# Patient Record
Sex: Male | Born: 1945
Health system: Southern US, Community
[De-identification: ages and names within clinical notes are randomized; demographics above are authoritative.]

## PROBLEM LIST (undated history)

## (undated) DIAGNOSIS — R7303 Prediabetes: Secondary | ICD-10-CM

## (undated) DIAGNOSIS — I10 Essential (primary) hypertension: Secondary | ICD-10-CM

## (undated) HISTORY — PX: HERNIA REPAIR: SHX51

---

## 2005-06-16 ENCOUNTER — Emergency Department (HOSPITAL_COMMUNITY): Admission: EM | Admit: 2005-06-16 | Discharge: 2005-06-16 | Payer: Self-pay | Admitting: Emergency Medicine

## 2005-06-29 ENCOUNTER — Inpatient Hospital Stay (HOSPITAL_COMMUNITY): Admission: RE | Admit: 2005-06-29 | Discharge: 2005-07-02 | Payer: Self-pay | Admitting: Orthopedic Surgery

## 2015-06-06 DIAGNOSIS — Z0000 Encounter for general adult medical examination without abnormal findings: Secondary | ICD-10-CM | POA: Diagnosis not present

## 2015-07-05 DIAGNOSIS — H25813 Combined forms of age-related cataract, bilateral: Secondary | ICD-10-CM | POA: Diagnosis not present

## 2015-09-09 DIAGNOSIS — H25811 Combined forms of age-related cataract, right eye: Secondary | ICD-10-CM | POA: Diagnosis not present

## 2015-09-13 DIAGNOSIS — I10 Essential (primary) hypertension: Secondary | ICD-10-CM | POA: Diagnosis not present

## 2015-09-27 DIAGNOSIS — H25811 Combined forms of age-related cataract, right eye: Secondary | ICD-10-CM | POA: Diagnosis not present

## 2015-09-28 DIAGNOSIS — H25811 Combined forms of age-related cataract, right eye: Secondary | ICD-10-CM | POA: Diagnosis not present

## 2015-10-25 DIAGNOSIS — I10 Essential (primary) hypertension: Secondary | ICD-10-CM | POA: Diagnosis not present

## 2015-10-25 DIAGNOSIS — Z79899 Other long term (current) drug therapy: Secondary | ICD-10-CM | POA: Diagnosis not present

## 2015-10-25 DIAGNOSIS — H25812 Combined forms of age-related cataract, left eye: Secondary | ICD-10-CM | POA: Diagnosis not present

## 2015-10-25 DIAGNOSIS — H259 Unspecified age-related cataract: Secondary | ICD-10-CM | POA: Diagnosis not present

## 2015-10-25 DIAGNOSIS — Z87891 Personal history of nicotine dependence: Secondary | ICD-10-CM | POA: Diagnosis not present

## 2015-10-25 DIAGNOSIS — M1990 Unspecified osteoarthritis, unspecified site: Secondary | ICD-10-CM | POA: Diagnosis not present

## 2015-10-26 DIAGNOSIS — H25812 Combined forms of age-related cataract, left eye: Secondary | ICD-10-CM | POA: Diagnosis not present

## 2015-11-08 DIAGNOSIS — I10 Essential (primary) hypertension: Secondary | ICD-10-CM | POA: Diagnosis not present

## 2015-11-08 DIAGNOSIS — R739 Hyperglycemia, unspecified: Secondary | ICD-10-CM | POA: Diagnosis not present

## 2015-11-22 DIAGNOSIS — Z961 Presence of intraocular lens: Secondary | ICD-10-CM | POA: Diagnosis not present

## 2015-12-20 DIAGNOSIS — R8290 Unspecified abnormal findings in urine: Secondary | ICD-10-CM | POA: Diagnosis not present

## 2015-12-20 DIAGNOSIS — I10 Essential (primary) hypertension: Secondary | ICD-10-CM | POA: Diagnosis not present

## 2015-12-20 DIAGNOSIS — Z125 Encounter for screening for malignant neoplasm of prostate: Secondary | ICD-10-CM | POA: Diagnosis not present

## 2015-12-20 DIAGNOSIS — Z0000 Encounter for general adult medical examination without abnormal findings: Secondary | ICD-10-CM | POA: Diagnosis not present

## 2016-02-15 DIAGNOSIS — J069 Acute upper respiratory infection, unspecified: Secondary | ICD-10-CM | POA: Diagnosis not present

## 2016-02-15 DIAGNOSIS — N289 Disorder of kidney and ureter, unspecified: Secondary | ICD-10-CM | POA: Diagnosis not present

## 2016-02-15 DIAGNOSIS — R05 Cough: Secondary | ICD-10-CM | POA: Diagnosis not present

## 2016-02-15 DIAGNOSIS — J029 Acute pharyngitis, unspecified: Secondary | ICD-10-CM | POA: Diagnosis not present

## 2016-02-15 DIAGNOSIS — R739 Hyperglycemia, unspecified: Secondary | ICD-10-CM | POA: Diagnosis not present

## 2016-02-15 DIAGNOSIS — E782 Mixed hyperlipidemia: Secondary | ICD-10-CM | POA: Diagnosis not present

## 2016-02-15 DIAGNOSIS — I10 Essential (primary) hypertension: Secondary | ICD-10-CM | POA: Diagnosis not present

## 2016-05-22 DIAGNOSIS — H35371 Puckering of macula, right eye: Secondary | ICD-10-CM | POA: Diagnosis not present

## 2016-05-22 DIAGNOSIS — Z961 Presence of intraocular lens: Secondary | ICD-10-CM | POA: Diagnosis not present

## 2016-06-11 DIAGNOSIS — R739 Hyperglycemia, unspecified: Secondary | ICD-10-CM | POA: Diagnosis not present

## 2016-06-11 DIAGNOSIS — E782 Mixed hyperlipidemia: Secondary | ICD-10-CM | POA: Diagnosis not present

## 2016-06-11 DIAGNOSIS — I10 Essential (primary) hypertension: Secondary | ICD-10-CM | POA: Diagnosis not present

## 2016-09-11 DIAGNOSIS — Z79899 Other long term (current) drug therapy: Secondary | ICD-10-CM | POA: Diagnosis not present

## 2016-09-11 DIAGNOSIS — I10 Essential (primary) hypertension: Secondary | ICD-10-CM | POA: Diagnosis not present

## 2016-09-11 DIAGNOSIS — R739 Hyperglycemia, unspecified: Secondary | ICD-10-CM | POA: Diagnosis not present

## 2016-09-11 DIAGNOSIS — E782 Mixed hyperlipidemia: Secondary | ICD-10-CM | POA: Diagnosis not present

## 2017-01-02 DIAGNOSIS — Z0000 Encounter for general adult medical examination without abnormal findings: Secondary | ICD-10-CM | POA: Diagnosis not present

## 2017-01-02 DIAGNOSIS — E785 Hyperlipidemia, unspecified: Secondary | ICD-10-CM | POA: Diagnosis not present

## 2017-05-27 DIAGNOSIS — Z961 Presence of intraocular lens: Secondary | ICD-10-CM | POA: Diagnosis not present

## 2017-05-27 DIAGNOSIS — H43812 Vitreous degeneration, left eye: Secondary | ICD-10-CM | POA: Diagnosis not present

## 2017-07-01 DIAGNOSIS — H43812 Vitreous degeneration, left eye: Secondary | ICD-10-CM | POA: Diagnosis not present

## 2017-08-28 DIAGNOSIS — Z79899 Other long term (current) drug therapy: Secondary | ICD-10-CM | POA: Diagnosis not present

## 2017-08-28 DIAGNOSIS — Z136 Encounter for screening for cardiovascular disorders: Secondary | ICD-10-CM | POA: Diagnosis not present

## 2017-08-28 DIAGNOSIS — I10 Essential (primary) hypertension: Secondary | ICD-10-CM | POA: Diagnosis not present

## 2017-08-28 DIAGNOSIS — Z125 Encounter for screening for malignant neoplasm of prostate: Secondary | ICD-10-CM | POA: Diagnosis not present

## 2018-03-03 DIAGNOSIS — J209 Acute bronchitis, unspecified: Secondary | ICD-10-CM | POA: Diagnosis not present

## 2018-03-03 DIAGNOSIS — I10 Essential (primary) hypertension: Secondary | ICD-10-CM | POA: Diagnosis not present

## 2018-03-03 DIAGNOSIS — Z9181 History of falling: Secondary | ICD-10-CM | POA: Diagnosis not present

## 2018-03-03 DIAGNOSIS — E785 Hyperlipidemia, unspecified: Secondary | ICD-10-CM | POA: Diagnosis not present

## 2018-03-03 DIAGNOSIS — Z1331 Encounter for screening for depression: Secondary | ICD-10-CM | POA: Diagnosis not present

## 2018-03-03 DIAGNOSIS — R972 Elevated prostate specific antigen [PSA]: Secondary | ICD-10-CM | POA: Diagnosis not present

## 2018-03-03 DIAGNOSIS — R739 Hyperglycemia, unspecified: Secondary | ICD-10-CM | POA: Diagnosis not present

## 2018-04-04 DIAGNOSIS — R001 Bradycardia, unspecified: Secondary | ICD-10-CM | POA: Diagnosis not present

## 2018-04-04 DIAGNOSIS — R079 Chest pain, unspecified: Secondary | ICD-10-CM | POA: Diagnosis not present

## 2018-04-04 DIAGNOSIS — R42 Dizziness and giddiness: Secondary | ICD-10-CM | POA: Diagnosis not present

## 2018-04-04 DIAGNOSIS — I951 Orthostatic hypotension: Secondary | ICD-10-CM | POA: Diagnosis not present

## 2018-04-04 DIAGNOSIS — I4510 Unspecified right bundle-branch block: Secondary | ICD-10-CM | POA: Diagnosis not present

## 2018-04-04 DIAGNOSIS — S2249XA Multiple fractures of ribs, unspecified side, initial encounter for closed fracture: Secondary | ICD-10-CM | POA: Diagnosis not present

## 2018-04-04 DIAGNOSIS — S2242XA Multiple fractures of ribs, left side, initial encounter for closed fracture: Secondary | ICD-10-CM | POA: Diagnosis not present

## 2018-04-04 DIAGNOSIS — K5730 Diverticulosis of large intestine without perforation or abscess without bleeding: Secondary | ICD-10-CM | POA: Diagnosis not present

## 2018-04-04 DIAGNOSIS — R109 Unspecified abdominal pain: Secondary | ICD-10-CM | POA: Diagnosis not present

## 2018-04-04 DIAGNOSIS — R0902 Hypoxemia: Secondary | ICD-10-CM | POA: Diagnosis not present

## 2018-04-04 DIAGNOSIS — R55 Syncope and collapse: Secondary | ICD-10-CM | POA: Diagnosis not present

## 2018-04-04 DIAGNOSIS — W19XXXA Unspecified fall, initial encounter: Secondary | ICD-10-CM | POA: Diagnosis not present

## 2018-04-04 DIAGNOSIS — R531 Weakness: Secondary | ICD-10-CM | POA: Diagnosis not present

## 2018-04-05 ENCOUNTER — Inpatient Hospital Stay (HOSPITAL_COMMUNITY)
Admission: AD | Admit: 2018-04-05 | Discharge: 2018-04-07 | DRG: 312 | Disposition: A | Discharge status: Home / Self Care | Payer: Medicare HMO | Source: Other Acute Inpatient Hospital | Attending: Cardiovascular Disease | Admitting: Cardiovascular Disease

## 2018-04-05 ENCOUNTER — Inpatient Hospital Stay (HOSPITAL_COMMUNITY): Payer: Medicare HMO

## 2018-04-05 ENCOUNTER — Encounter (HOSPITAL_COMMUNITY): Payer: Self-pay | Admitting: Cardiology

## 2018-04-05 DIAGNOSIS — Z8249 Family history of ischemic heart disease and other diseases of the circulatory system: Secondary | ICD-10-CM

## 2018-04-05 DIAGNOSIS — K0889 Other specified disorders of teeth and supporting structures: Secondary | ICD-10-CM | POA: Diagnosis present

## 2018-04-05 DIAGNOSIS — Y92002 Bathroom of unspecified non-institutional (private) residence single-family (private) house as the place of occurrence of the external cause: Secondary | ICD-10-CM

## 2018-04-05 DIAGNOSIS — Z882 Allergy status to sulfonamides status: Secondary | ICD-10-CM | POA: Diagnosis not present

## 2018-04-05 DIAGNOSIS — S2249XA Multiple fractures of ribs, unspecified side, initial encounter for closed fracture: Secondary | ICD-10-CM

## 2018-04-05 DIAGNOSIS — S2242XA Multiple fractures of ribs, left side, initial encounter for closed fracture: Secondary | ICD-10-CM | POA: Diagnosis present

## 2018-04-05 DIAGNOSIS — Z79899 Other long term (current) drug therapy: Secondary | ICD-10-CM

## 2018-04-05 DIAGNOSIS — W1812XA Fall from or off toilet with subsequent striking against object, initial encounter: Secondary | ICD-10-CM | POA: Diagnosis present

## 2018-04-05 DIAGNOSIS — R55 Syncope and collapse: Secondary | ICD-10-CM | POA: Diagnosis not present

## 2018-04-05 DIAGNOSIS — Z7982 Long term (current) use of aspirin: Secondary | ICD-10-CM | POA: Diagnosis not present

## 2018-04-05 DIAGNOSIS — R109 Unspecified abdominal pain: Secondary | ICD-10-CM | POA: Diagnosis not present

## 2018-04-05 DIAGNOSIS — I10 Essential (primary) hypertension: Secondary | ICD-10-CM | POA: Diagnosis not present

## 2018-04-05 DIAGNOSIS — R001 Bradycardia, unspecified: Secondary | ICD-10-CM | POA: Diagnosis present

## 2018-04-05 DIAGNOSIS — Z9181 History of falling: Secondary | ICD-10-CM

## 2018-04-05 DIAGNOSIS — I4510 Unspecified right bundle-branch block: Secondary | ICD-10-CM | POA: Diagnosis not present

## 2018-04-05 DIAGNOSIS — S2239XA Fracture of one rib, unspecified side, initial encounter for closed fracture: Secondary | ICD-10-CM

## 2018-04-05 DIAGNOSIS — R079 Chest pain, unspecified: Secondary | ICD-10-CM | POA: Diagnosis not present

## 2018-04-05 HISTORY — DX: Essential (primary) hypertension: I10

## 2018-04-05 HISTORY — DX: Prediabetes: R73.03

## 2018-04-05 LAB — CBC WITH DIFFERENTIAL/PLATELET
ABS IMMATURE GRANULOCYTES: 0.04 10*3/uL (ref 0.00–0.07)
Basophils Absolute: 0 10*3/uL (ref 0.0–0.1)
Basophils Relative: 0 %
Eosinophils Absolute: 0 10*3/uL (ref 0.0–0.5)
Eosinophils Relative: 0 %
HCT: 42.4 % (ref 39.0–52.0)
Hemoglobin: 14.1 g/dL (ref 13.0–17.0)
Immature Granulocytes: 0 %
LYMPHS ABS: 1.8 10*3/uL (ref 0.7–4.0)
Lymphocytes Relative: 16 %
MCH: 30.7 pg (ref 26.0–34.0)
MCHC: 33.3 g/dL (ref 30.0–36.0)
MCV: 92.4 fL (ref 80.0–100.0)
Monocytes Absolute: 1.2 10*3/uL — ABNORMAL HIGH (ref 0.1–1.0)
Monocytes Relative: 11 %
NRBC: 0 % (ref 0.0–0.2)
Neutro Abs: 8 10*3/uL — ABNORMAL HIGH (ref 1.7–7.7)
Neutrophils Relative %: 73 %
Platelets: 172 10*3/uL (ref 150–400)
RBC: 4.59 MIL/uL (ref 4.22–5.81)
RDW: 13.2 % (ref 11.5–15.5)
WBC: 11 10*3/uL — ABNORMAL HIGH (ref 4.0–10.5)

## 2018-04-05 LAB — PROTIME-INR
INR: 1 (ref 0.8–1.2)
Prothrombin Time: 12.8 seconds (ref 11.4–15.2)

## 2018-04-05 LAB — MRSA PCR SCREENING: MRSA by PCR: NEGATIVE

## 2018-04-05 LAB — URINALYSIS, ROUTINE W REFLEX MICROSCOPIC
Bilirubin Urine: NEGATIVE
Glucose, UA: 50 mg/dL — AB
Hgb urine dipstick: NEGATIVE
KETONES UR: 5 mg/dL — AB
Leukocytes,Ua: NEGATIVE
Nitrite: NEGATIVE
PROTEIN: NEGATIVE mg/dL
Specific Gravity, Urine: 1.042 — ABNORMAL HIGH (ref 1.005–1.030)
pH: 5 (ref 5.0–8.0)

## 2018-04-05 LAB — COMPREHENSIVE METABOLIC PANEL
ALT: 16 U/L (ref 0–44)
AST: 19 U/L (ref 15–41)
Albumin: 3.3 g/dL — ABNORMAL LOW (ref 3.5–5.0)
Alkaline Phosphatase: 51 U/L (ref 38–126)
Anion gap: 8 (ref 5–15)
BUN: 11 mg/dL (ref 8–23)
CO2: 17 mmol/L — ABNORMAL LOW (ref 22–32)
Calcium: 8.5 mg/dL — ABNORMAL LOW (ref 8.9–10.3)
Chloride: 108 mmol/L (ref 98–111)
Creatinine, Ser: 0.92 mg/dL (ref 0.61–1.24)
GFR calc Af Amer: >60 mL/min (ref >60)
GFR calc non Af Amer: >60 mL/min (ref >60)
Glucose, Bld: 108 mg/dL — ABNORMAL HIGH (ref 70–99)
POTASSIUM: 4.3 mmol/L (ref 3.5–5.1)
SODIUM: 133 mmol/L — AB (ref 135–145)
Total Bilirubin: 1.1 mg/dL (ref 0.3–1.2)
Total Protein: 6.3 g/dL — ABNORMAL LOW (ref 6.5–8.1)

## 2018-04-05 LAB — ECHOCARDIOGRAM COMPLETE
Height: 72 in
Weight: 2987.67 [oz_av]

## 2018-04-05 LAB — MAGNESIUM: Magnesium: 1.8 mg/dL (ref 1.7–2.4)

## 2018-04-05 MED ORDER — IPRATROPIUM-ALBUTEROL 0.5-2.5 (3) MG/3ML IN SOLN: 3.0000 mL | RESPIRATORY_TRACT | Status: DC | PRN

## 2018-04-05 MED ORDER — DOPAMINE-DEXTROSE 3.2-5 MG/ML-% IV SOLN: 5.0000 ug/kg/min | INTRAVENOUS | Status: DC

## 2018-04-05 MED ORDER — FOLIC ACID 1 MG PO TABS
1.0000 mg | ORAL_TABLET | Freq: Every day | ORAL | Status: DC
  Administered 2018-04-05 – 2018-04-07 (×3): 1 mg via ORAL
  Filled 2018-04-05 (×3): qty 1

## 2018-04-05 MED ORDER — TRAMADOL HCL 50 MG PO TABS
50.0000 mg | ORAL_TABLET | Freq: Four times a day (QID) | ORAL | Status: DC
  Administered 2018-04-05 – 2018-04-07 (×9): 50 mg via ORAL
  Filled 2018-04-05 (×9): qty 1

## 2018-04-05 MED ORDER — ATROPINE SULFATE 1 MG/10ML IJ SOSY
PREFILLED_SYRINGE | INTRAMUSCULAR | Status: AC
  Administered 2018-04-05: 0.5 mg via INTRAVENOUS
  Filled 2018-04-05: qty 10

## 2018-04-05 MED ORDER — THIAMINE HCL 100 MG/ML IJ SOLN
100.0000 mg | Freq: Every day | INTRAMUSCULAR | Status: DC
  Administered 2018-04-05: 100 mg via INTRAVENOUS
  Filled 2018-04-05: qty 2

## 2018-04-05 MED ORDER — ONDANSETRON HCL 4 MG/2ML IJ SOLN
4.0000 mg | Freq: Four times a day (QID) | INTRAMUSCULAR | Status: DC | PRN
  Administered 2018-04-06: 4 mg via INTRAVENOUS
  Filled 2018-04-05: qty 2

## 2018-04-05 MED ORDER — CALCIUM CARBONATE ANTACID 500 MG PO CHEW
1.0000 | CHEWABLE_TABLET | Freq: Three times a day (TID) | ORAL | Status: DC | PRN
  Administered 2018-04-05: 200 mg via ORAL
  Filled 2018-04-05: qty 1

## 2018-04-05 MED ORDER — ACETAMINOPHEN 325 MG PO TABS
650.0000 mg | ORAL_TABLET | Freq: Four times a day (QID) | ORAL | Status: DC | PRN
  Administered 2018-04-05: 650 mg via ORAL
  Filled 2018-04-05: qty 2

## 2018-04-05 MED ORDER — SODIUM CHLORIDE 0.9 % IV SOLN
INTRAVENOUS | Status: AC
Start: 2018-04-05 — End: 2018-04-05
  Administered 2018-04-05: 04:00:00 via INTRAVENOUS

## 2018-04-05 MED ORDER — ACETAMINOPHEN 325 MG PO TABS: 650.0000 mg | ORAL_TABLET | ORAL | Status: DC | PRN

## 2018-04-05 MED ORDER — HEPARIN SODIUM (PORCINE) 5000 UNIT/ML IJ SOLN
5000.0000 [IU] | Freq: Three times a day (TID) | INTRAMUSCULAR | Status: DC
  Administered 2018-04-05 – 2018-04-07 (×7): 5000 [IU] via SUBCUTANEOUS
  Filled 2018-04-05 (×6): qty 1

## 2018-04-05 MED ORDER — LIDOCAINE 5 % EX PTCH
1.0000 | MEDICATED_PATCH | Freq: Every day | CUTANEOUS | Status: DC
  Administered 2018-04-05 – 2018-04-07 (×3): 1 via TRANSDERMAL
  Filled 2018-04-05 (×4): qty 1

## 2018-04-05 MED ORDER — ATROPINE SULFATE 1 MG/10ML IJ SOSY
0.5000 mg | PREFILLED_SYRINGE | INTRAMUSCULAR | Status: DC | PRN
  Administered 2018-04-05: 0.5 mg via INTRAVENOUS

## 2018-04-05 NOTE — Consult Note (Signed)
Reason for Consult:L rib FXs Referring Physician: Jerilynn Mages Jerome Houston is an 73 y.o. male.  HPI: Jerome Houston has a history of hypertension and prediabetes.  He had been feeling well until yesterday when he had 2 episodes of lightheadedness and syncope.  The second time, he fell in the bathroom and struck his left side on the tub.  He was brought to the emergency department at [facility] for further evaluation.  He was found to have multiple left rib fractures reported to me by the PA at the [facility] ED as 7-11 with no PTX. While in the ED, he had episodes of bradycardia into the 40s with associated drop in systolic blood pressure to the 80s.  He was transferred and admitted to the cardiology service for further work-up of this bradycardia and possible pacemaker placement.  We are consulted to assist with the rib fractures.  He does complain of left-sided chest pain, exacerbated by deep inspiration.  He denies pain elsewhere.  Past medical history: Hypertension, prediabetes  No family history on file.  Social History:  has no history on file for tobacco, alcohol, and drug.  Allergies: Allergies not on file  Medications: I have reviewed the patient's current medications.  Results for orders placed or performed during the hospital encounter of 04/05/18 (from the past 48 hour(s))  MRSA PCR Screening     Status: None   Collection Time: 04/05/18  3:44 AM  Result Value Ref Range   MRSA by PCR NEGATIVE NEGATIVE    Comment:        The GeneXpert MRSA Assay (FDA approved for NASAL specimens only), is one component of a comprehensive MRSA colonization surveillance program. It is not intended to diagnose MRSA infection nor to guide or monitor treatment for MRSA infections. Performed at [facility], [place] [address], [place], [place] [address]     No results found.  Review of Systems  Constitutional: Negative for chills and fever.  HENT: Negative for hearing loss.   Eyes: Negative for  blurred vision and double vision.  Respiratory: Negative for cough and sputum production.        Left rib pain with deep inspiration  Cardiovascular: Positive for chest pain.  Gastrointestinal: Negative for abdominal pain, nausea and vomiting.  Genitourinary: Negative.   Musculoskeletal: Negative.   Skin: Negative.   Neurological:       See HPI  Endo/Heme/Allergies: Negative.   Psychiatric/Behavioral: Negative.    Blood pressure 128/78, pulse 71, resp. rate (!) 25, height 6' (1.829 m), weight 84.7 kg, SpO2 97 %. Physical Exam  Constitutional: He is oriented to person, place, and time. He appears well-developed and well-nourished. No distress.  HENT:  Head: Normocephalic.  Mouth/Throat: Oropharynx is clear and moist.  Poor dentition  Eyes: Pupils are equal, round, and reactive to light.  Neck:  nontender  Cardiovascular:  HR 60s  Respiratory: Effort normal and breath sounds normal. No respiratory distress. He has no wheezes. He has no rales. He exhibits tenderness.  L rib tenderness  GI: Soft. He exhibits no distension. There is no abdominal tenderness. There is no rebound and no guarding.  Musculoskeletal:        General: No deformity or edema.  Neurological: He is alert and oriented to person, place, and time. He displays no atrophy and no tremor. He exhibits normal muscle tone. He displays no seizure activity. GCS eye subscore is 4. GCS verbal subscore is 5. GCS motor subscore is 6.  Skin: Skin  is warm.  Psychiatric: He has a normal mood and affect.    Assessment/Plan: Bradycardia with syncope - per Cardiology  Fall with L rib FX 7-11 - pain control and pulmonary toilet, did 1250cc on IS for me, F/U CXR in AM. Added scheduled Ultram and PRN bronchodilators. We will follow.  Zenovia Jarred 04/05/2018, 6:51 AM

## 2018-04-05 NOTE — Progress Notes (Signed)
    Syncope, bradycardia heart rate in the 40s drop of BPs into the 80s over 60s.  Responded to atropine.  According to prior consult note, heart rate would drop slowly on the monitors 80s 60s 50s to 40s he would lose his collar complaint of nausea and had pain rated at 8/10.  No prior syncopal history.  He was noted to be orthostatic by blood pressure without any appropriate heart rate response may be chronotropic incompetence.  Right bundle branch block noted on ECG.  Unclear etiology.  No ischemic signs.  Severe pain suggesting vagal tone played a role.  This does not however explain the 2 episodes that occurred prior to admission.  Currently getting volume resuscitation pain control.  Trauma, surgery has seen for his left rib fractures, pulmonary toilet.  Consulting EP.  Given symptomatic bradycardia with possible chronotropic incompetence. ? Pacer  Please see excellent consult note by Dr. Marletta Lor for further details.  Candee Furbish, MD

## 2018-04-05 NOTE — H&P (Signed)
Cardiology Admission History and Physical:   Patient ID: Labrian Torregrossa MRN: [id number]; DOB: 11/03/1945   Admission date: 04/05/2018  Primary Care Provider: Earlyne Iba, NP Primary Cardiologist: No primary care provider on file.  Primary Electrophysiologist:  None   Chief Complaint:  syncope  History of Present Illness:   Mr. Hoben is a 73 year old man with HTN and prediabetes who presents with syncope. Per patient and wife was in his usual state of health until yesterday afternoon. While seated at his sons house, wife noted that patient began to look "unwell". He arose from a chair and started walking quickly to the bathroom when she heard a noise and found him on the floor. He denies antecedent chest pain or tightness, did not feel dizzy or lightheaded, but remembers feeling a strong urge to urinate. When he came to, his wife noted that he had urinated on himself. Later that evening while at home he was on the toilet urinating and suddenly became lightheaded, he feel off the toilet striking his L side on the tub. After this episode he was brought to the [facility] ED for evaluation.   In the ER, initial EKG with sinus rhythm and RBBB. Was initially normotensive with labs notable for troponin < 0.01, proBNP 75, normal TSH. CXR performed and notable for rib fractures of 5th - 8th rib on the L side. Was evaluated by trauma surgery and deemed high risk for pulmonary complications, for which he was planned for transfer and admission to [facility].   While in the ER, however, patient had two episodes of bradycardia to the 40s. EKG sent from [place] mostly [place] but not rhythm strip. No telemetry available for review. During both episodes patients unresponsive with drop in BP documented to 80s/60s. He reportedly responded to atropine. Per his wife, she noted that his HR would drop slowly on the monitors (80s-> 60s > 50s -> 40s) around the time of these episodes and that he would lose his color and complain of  nausea. He rated his pain around these episodes as ~8/10.   He has never syncopized before. No history of heart disease or cardiomyopathy. Per wife leads a sedentary lifestyle.   Medications Prior to Admission: Lisinopril 20mg  Daily ASA 81 mg Daily  Allergies:   Allergies not on file  Social History:   Drinks ~ 2 beers per night per report.   Family History:   The patient's family history is not on file.    Review of Systems: [y] = yes, [ ]  = no     General: Weight gain [ ] ; Weight loss [ ] ; Anorexia [ ] ; Fatigue [ ] ; Fever [ ] ; Chills [ ] ; Weakness [ ]    Cardiac: Chest pain/pressure [ ] ; Resting SOB [ ] ; Exertional SOB [ ] ; Orthopnea [ ] ; Pedal Edema [ ] ; Palpitations [ ] ; Syncope [ y]; Presyncope [ ] ; Paroxysmal nocturnal dyspnea[ ]    Pulmonary: Cough [ ] ; Wheezing[ ] ; Hemoptysis[ ] ; Sputum [ ] ; Snoring [ ]    GI: Vomiting[ ] ; Dysphagia[ ] ; Melena[ ] ; Hematochezia [ ] ; Heartburn[ ] ; Abdominal pain [ ] ; Constipation [ ] ; Diarrhea [ ] ; BRBPR [ ]    GU: Hematuria[ ] ; Dysuria [ ] ; Nocturia[ ]    Vascular: Pain in legs with walking [ ] ; Pain in feet with lying flat [ ] ; Non-healing sores [ ] ; Stroke [ ] ; TIA [ ] ; Slurred speech [ ] ;   Neuro: Headaches[ ] ; Vertigo[ ] ; Seizures[ ] ; Paresthesias[ ] ;Blurred vision [ ] ; Diplopia [ ] ;  Vision changes [ ]    Ortho/Skin: Arthritis [ ] ; Joint pain [ ] ; Muscle pain [ ] ; Joint swelling [ ] ; Back Pain [ ] ; Rash [ ]    Psych: Depression[ ] ; Anxiety[ ]    Heme: Bleeding problems [ ] ; Clotting disorders [ ] ; Anemia [ ]    Endocrine: Diabetes [ ] ; Thyroid dysfunction[ ]   Physical Exam/Data:  Orthostatic Vitals (per OSH records): Supine 100/62 P 62 Sitting 92/54 HR 71 Standing 88/64 HR 58  General:  Well nourished, well developed, in no acute distress. Poor dentition. Splinting.  HEENT: Poor dentition.  Neck: no JVD Endocrine:  No thryomegaly Vascular: No carotid bruits; FA pulses 2+ bilaterally without bruits  Cardiac:  normal S1, S2; RRR;  no murmur Lungs:  clear to auscultation bilaterally, no wheezing, rhonchi or rales. Decreased breath sounds.  Abd: soft, nontender, no hepatomegaly  Ext: no edema Musculoskeletal:  No deformities, BUE and BLE strength normal and equal Skin: warm and dry  Neuro:  CNs 2-12 intact, no focal abnormalities noted Psych:  Normal affect   EKG:  The ECG that was done demonstrates sinus bradycardia with RBBB.   Relevant CV Studies: CTA Chest: Aorta normal in caliber.  Scattered coronary calcifications.  No pericardial effusion.  No evidence of pulmonary embolism.  Dependent bilateral atelectasis.  Fractures of 5th, 6th, 7th, and 10th ribs  Laboratory Data: WBC 10 Hgb 16.3 MCV 93 Plt 228 Na 134 K 4.1 CO2 23 AG 16 BUN 17 Cr 1.0  Osm 261 T Bili 0.5 AST 27 ALT 21 Trop < 0.01 T Prot 8.3, Alb 4.5, Protein Gap 3.8   Assessment and Plan:   Mr. Burnsworth is a 73 year old man who presents for evaluation of syncope and is found to have multiple episodes of symptomatic bradycardia. Interestingly, patient notably quite orthostatic by blood pressure, but without appropriate compensatory HR response, suggestive of some degree of chronotropic incompetence. Has RBBB on EKG (of unclear chronicity) and no known cardiomyopathy. No signs or symptoms of ischemia and on no nodally acting agents. Was in severe pain at the time of his episode in the ER (suggesting that vagal tone may have played a role), but this doesn't explain the two episodes that occurred prior to admission.   Thus, will plan to volume resuscitate him, control his pain, and monitor him on telemetry overnight. Will consult EP in the morning for consideration of PPM given documented symptomatic bradycardia and ?chronotropic incompetence.   #Symptomatic ?Sinus Bradycardia #Syncope -- Pacer pads on patient; atropine at the bedside -- Additional volume resuscitation with 1L NS. It may be that volume depletion is why he doesn't tolerate low sinus  rates? -- If episode recurs will consult interventional team for TVP placement.  -- Dopamine hung at the bedside if needed.  -- TTE in the AM to evaluate for cardiomyopathy; would pay particular attention if any evidence of infiltrative CM given ?autonomic dysfunction and borderline gamma gap on LFTs.  -- EP consult in the AM. NPO in case of PPM placement. [place] for sips this AM with meds.   #Rib Fractures s/p Fall -- Will consult trauma surgery to follow  -- Pain control with tylenol and topical lidocaine -- Will avoid sedating agents given ongoing splinting and risk of hypovenilation -- Incentive spirometer -- Supplemental oxygen   #HTN -- Hold home lisinopril.  #EtOH History -- Thiamine and folate supplementation -- Monitor for s/s of withdrawal.   Severity of Illness: The appropriate patient status for this patient is INPATIENT.  Inpatient status is judged to be reasonable and necessary in order to provide the required intensity of service to ensure the patient's safety. The patient's presenting symptoms, physical exam findings, and initial radiographic and laboratory data in the context of their chronic comorbidities is felt to place them at high risk for further clinical deterioration. Furthermore, it is not anticipated that the patient will be medically stable for discharge from the hospital within 2 midnights of admission. The following factors support the patient status of inpatient.   " The patient's presenting symptoms include syncope. " The worrisome physical exam findings include hypotension, bradycardia.  " The initial radiographic and laboratory data are worrisome because of  " The chronic co-morbidities include HTN.  * I certify that at the point of admission it is my clinical judgment that the patient will require inpatient hospital care spanning beyond 2 midnights from the point of admission due to high intensity of service, high risk for further deterioration and high  frequency of surveillance required.*    For questions or updates, please contact [place] Please consult [email or web address] for contact info under    Signed, Milus Banister, MD  04/05/2018 3:36 AM

## 2018-04-05 NOTE — Consult Note (Signed)
Cardiology Consultation:   Patient ID: Jerome Houston MRN: [id number]; DOB: 1945-02-14  Admit date: 04/05/2018 Date of Consult: 04/05/2018  Primary Care Provider: Earlyne Iba, NP Primary Cardiologist: No primary care provider on file.  Primary Electrophysiologist:  None    Patient Profile:   Jerome Houston is a 73 y.o. male with a hx of hypertension who is being seen today for the evaluation of falls and near syncope at the request of Jerome Houston.  History of Present Illness:   Jerome Houston is a 73 year old male with a history of hypertension and prediabetes.  He presented to [facility] with syncope.  He was in his usual state of health until yesterday afternoon.  He was sitting down at his son's house when his wife said that he looked unwell.  He arose from the chair and started walking quickly to the bathroom.  She heard a noise and found him on the floor.  He did not have preceding chest tightness.  He did not feel dizzy or lightheaded.  He remembers a strong urge to urinate.  When he came to he had urinated on himself.  Later in that evening, he was on the toilet urinating and suddenly became lightheaded.  He fell and struck his left side on the tub.  He has subsequently developed rib fractures in ribs 5 through 8.  In the emergency room initial ECG showed a right bundle branch block.  Labs were essentially normal.  While in the emergency room at [facility], he had 2 episodes of bradycardia into the 40s.  Per report, he did respond to atropine.  His wife says that his heart rate slowly dropped on the monitors from the 80s to the 40s.  The patient lost his color and complained of nausea.  He was having pain during these episodes.  Unfortunately no rhythm strip was sent with him.  Past Medical History:  Diagnosis Date  . Hypertension   . Prediabetes     Past Surgical History:  Procedure Laterality Date  . HERNIA REPAIR       Home Medications:  Prior to Admission  medications   Medication Sig Start Date End Date Taking? Authorizing Provider  aspirin EC 81 MG tablet Take 81 mg by mouth daily.   Yes [provider]  lisinopril (PRINIVIL,ZESTRIL) 20 MG tablet Take 20 mg by mouth daily. 02/23/18  Yes [provider]    Inpatient Medications: Scheduled Meds: . folic acid  1 mg Oral Daily  . heparin  5,000 Units Subcutaneous Q8H  . lidocaine  1 patch Transdermal Q0600  . thiamine  100 mg Intravenous Daily  . traMADol  50 mg Oral Q6H   Continuous Infusions:  PRN Meds: acetaminophen, atropine, calcium carbonate, ipratropium-albuterol, ondansetron (ZOFRAN) IV  Allergies:    Allergies  Allergen Reactions  . Sulfur Other (See Comments)    Parents told him. Not sure of reaction    Social History:   Social History   Socioeconomic History  . Marital status: Married    Spouse name: Not on file  . Number of children: Not on file  . Years of education: Not on file  . Highest education level: Not on file  Occupational History  . Not on file  Social Needs  . Financial resource strain: Not on file  . Food insecurity:    Worry: Not on file    Inability: Not on file  . Transportation needs:    Medical: Not on file  Non-medical: Not on file  Tobacco Use  . Smoking status: Not on file  Substance and Sexual Activity  . Alcohol use: Not on file  . Drug use: Not on file  . Sexual activity: Not on file  Lifestyle  . Physical activity:    Days per week: Not on file    Minutes per session: Not on file  . Stress: Not on file  Relationships  . Social connections:    Talks on phone: Not on file    Gets together: Not on file    Attends religious service: Not on file    Active member of club or organization: Not on file    Attends meetings of clubs or organizations: Not on file    Relationship status: Not on file  . Intimate partner violence:    Fear of current or ex partner: Not on file    Emotionally abused: Not on file     Physically abused: Not on file    Forced sexual activity: Not on file  Other Topics Concern  . Not on file  Social History Narrative  . Not on file    Family History:    Family History  Problem Relation Age of Onset  . Coronary artery disease Mother   . Coronary artery disease Father      ROS:  Please see the history of present illness.   All other ROS reviewed and negative.     Physical Exam/Data:   Vitals:   04/05/18 1100 04/05/18 1200 04/05/18 1300 04/05/18 1400  BP: (!) 152/105   129/75  Pulse: 76 80 86 75  Resp: 15 (!) 24 (!) 31 (!) 22  Temp: 98.4 F (36.9 C)     TempSrc: Oral     SpO2: 93% 91% 95% 95%  Weight:      Height:        Intake/Output Summary (Last 24 hours) at 04/05/2018 1437 Last data filed at 04/05/2018 1400 Gross per 24 hour  Intake 1269.83 ml  Output 1125 ml  Net 144.83 ml   Last 3 Weights 04/05/2018  Weight (lbs) 186 lb 11.7 oz  Weight (kg) 84.7 kg     Body mass index is 25.33 kg/m.  General:  Well nourished, well developed, in no acute distress HEENT: normal Lymph: no adenopathy Neck: no JVD Endocrine:  No thryomegaly Vascular: No carotid bruits; FA pulses 2+ bilaterally without bruits  Cardiac:  normal S1, S2; RRR; no murmur  Lungs:  clear to auscultation bilaterally, no wheezing, rhonchi or rales  Abd: soft, nontender, no hepatomegaly  Ext: no edema Musculoskeletal:  No deformities, BUE and BLE strength normal and equal Skin: warm and dry  Neuro:  CNs 2-12 intact, no focal abnormalities noted Psych:  Normal affect   EKG:  The EKG was personally reviewed and demonstrates: Sinus rhythm, right bundle branch block, rate 59 Telemetry:  Telemetry was personally reviewed and demonstrates: Sinus rhythm  Relevant CV Studies: TTE 04/05/18  1. The left ventricle has normal systolic function with an ejection fraction of 60-65%. The cavity size was normal. There is mildly increased left ventricular wall thickness. Left ventricular diastolic  Doppler parameters are consistent with  pseudonormalization.  2. The right ventricle has normal systolic function. The cavity was normal. There is no increase in right ventricular wall thickness.  3. The mitral valve is grossly normal. Moderate thickening of the mitral valve leaflet.  4. The aortic valve is grossly normal Mild thickening of the aortic valve no  stenosis of the aortic valve.  5. The interatrial septum was not assessed.  Laboratory Data:  Chemistry Recent Labs  Lab 04/05/18 0800  NA 133*  K 4.3  CL 108  CO2 17*  GLUCOSE 108*  BUN 11  CREATININE 0.92  CALCIUM 8.5*  GFRNONAA >60  GFRAA >60  ANIONGAP 8    Recent Labs  Lab 04/05/18 0800  PROT 6.3*  ALBUMIN 3.3*  AST 19  ALT 16  ALKPHOS 51  BILITOT 1.1   Hematology Recent Labs  Lab 04/05/18 0800  WBC 11.0*  RBC 4.59  HGB 14.1  HCT 42.4  MCV 92.4  MCH 30.7  MCHC 33.3  RDW 13.2  PLT 172   Cardiac EnzymesNo results for input(s): TROPONINI in the last 168 hours. No results for input(s): TROPIPOC in the last 168 hours.  BNPNo results for input(s): BNP, PROBNP in the last 168 hours.  DDimer No results for input(s): DDIMER in the last 168 hours.  Radiology/Studies:  No results found.  Assessment and Plan:   1. Syncope: At this point, it is unclear to me as to the cause of his episodes of syncope.  We Cason Luffman keep pacer pads on.  As he has not had any further arrhythmias, we Khalilah Hoke plan to move him to the floor.  He has potentially had multiple episodes of symptomatic bradycardia, 2 of which were when he was dealing with acute rib fractures.  It is unclear to me what his rhythm was, though, when he did have the falls at home.  He has no obvious signs of ischemia and is on no AV nodal blocking agents.  We Kiernan Atkerson continue to monitor him on telemetry over the next 24 to 48 hours to see if he has any bradycardia that could indicate pacemaker implantation.  He was quite orthostatic previously without a compensatory  heart rate response.  This could be a sign of chronotropic incompetence.  It also may be that he has a very high vagal tone causing his episodes of bradycardia. 2. Rib fractures: Currently on Tylenol and topical lidocaine.  Pain is overall well controlled. 3. Hypertension: Continue lisinopril 4. Alcohol history: Thiamine and folate supplementation per H&P, monitoring for signs and symptoms of withdrawal   For questions or updates, please contact [place] Please consult [email or web address] for contact info under     Signed, Davan Hark Meredith Leeds, MD  04/05/2018 2:37 PM

## 2018-04-05 NOTE — Progress Notes (Signed)
  Echocardiogram 2D Echocardiogram has been performed.  Jerome Houston 04/05/2018, 11:00 AM

## 2018-04-06 ENCOUNTER — Inpatient Hospital Stay (HOSPITAL_COMMUNITY): Payer: Medicare HMO

## 2018-04-06 MED ORDER — VITAMIN B-1 100 MG PO TABS
100.0000 mg | ORAL_TABLET | Freq: Every day | ORAL | Status: DC
  Administered 2018-04-06 – 2018-04-07 (×2): 100 mg via ORAL
  Filled 2018-04-06 (×2): qty 1

## 2018-04-06 MED ORDER — LISINOPRIL 20 MG PO TABS
20.0000 mg | ORAL_TABLET | Freq: Every day | ORAL | Status: DC
  Administered 2018-04-06 – 2018-04-07 (×2): 20 mg via ORAL
  Filled 2018-04-06 (×2): qty 1

## 2018-04-06 NOTE — Progress Notes (Signed)
       Subjective: CC: Left ribcage pain Patient reports pain in his left ribcage only when he moves or accidentally touches the area. No current pain. No SOB. Denies other areas of pain. Using IS.   Objective: Vital signs in last 24 hours: Temp:  [97.8 F (36.6 C)-98.4 F (36.9 C)] 98 F (36.7 C) (03/22 0629) Pulse Rate:  [56-86] 81 (03/22 0650) Resp:  [15-31] 18 (03/22 0650) BP: (119-160)/(75-105) 160/95 (03/22 0650) SpO2:  [91 %-95 %] 94 % (03/22 0650) Last BM Date: 04/05/18  Intake/Output from previous day: 03/21 0701 - 03/22 0700 In: 1736.2 [P.O.:990; I.V.:746.2] Out: 2525 [Urine:2525] Intake/Output this shift: No intake/output data recorded.  PE: Gen: Awake and alert Heart: RRR. 70's on monitor while in room Lungs: CTA b/l. Normal rate and effort.  Chest wall: Tenderness over left chest wall. No crepitus or retractions.  Abd: Soft, ND, NT, +BS   Lab Results:  Recent Labs    04/05/18 0800  WBC 11.0*  HGB 14.1  HCT 42.4  PLT 172   BMET Recent Labs    04/05/18 0800  NA 133*  K 4.3  CL 108  CO2 17*  GLUCOSE 108*  BUN 11  CREATININE 0.92  CALCIUM 8.5*   PT/INR Recent Labs    04/05/18 0800  LABPROT 12.8  INR 1.0   CMP     Component Value Date/Time   NA 133 (L) 04/05/2018 0800   K 4.3 04/05/2018 0800   CL 108 04/05/2018 0800   CO2 17 (L) 04/05/2018 0800   GLUCOSE 108 (H) 04/05/2018 0800   BUN 11 04/05/2018 0800   CREATININE 0.92 04/05/2018 0800   CALCIUM 8.5 (L) 04/05/2018 0800   PROT 6.3 (L) 04/05/2018 0800   ALBUMIN 3.3 (L) 04/05/2018 0800   AST 19 04/05/2018 0800   ALT 16 04/05/2018 0800   ALKPHOS 51 04/05/2018 0800   BILITOT 1.1 04/05/2018 0800   GFRNONAA >60 04/05/2018 0800   GFRAA >60 04/05/2018 0800   Lipase  No results found for: LIPASE     Studies/Results: No results found.  Anti-infectives: Anti-infectives (From admission, onward)   None       Assessment/Plan Bradycardia with syncope - per Cardiology  Fall  with L rib FX 7-11  - pain control and pulmonary toilet  - PRN bronchodilators - F/u CXR  FEN - HH VTE - SCDs, heparin ID - None  Plan: Mobilize, pulm toliet, pain control, follow up on CXR   LOS: 1 day    Jillyn Ledger , [facility] 04/06/2018, 9:15 AM Pager: [phone]

## 2018-04-06 NOTE — Progress Notes (Signed)
Progress Note  Patient Name: Jerome Houston Date of Encounter: 04/06/2018  Primary Cardiologist: No primary care provider on file.   Subjective   Jerome Houston feeling well.  Has had no further episodes of syncope since being in the hospital.  Inpatient Medications    Scheduled Meds: . folic acid  1 mg Oral Daily  . heparin  5,000 Units Subcutaneous Q8H  . lidocaine  1 patch Transdermal Q0600  . thiamine  100 mg Intravenous Daily  . traMADol  50 mg Oral Q6H   Continuous Infusions:  PRN Meds: acetaminophen, atropine, calcium carbonate, ipratropium-albuterol, ondansetron (ZOFRAN) IV   Vital Signs    Vitals:   04/05/18 2025 04/05/18 2030 04/06/18 0629 04/06/18 0650  BP: (!) 153/92   (!) 160/95  Pulse: 62   81  Resp:  18  18  Temp:  97.8 F (36.6 C) 98 F (36.7 C)   TempSrc:  Oral Oral   SpO2: 93%   94%  Weight:      Height:        Intake/Output Summary (Last 24 hours) at 04/06/2018 1016 Last data filed at 04/06/2018 0600 Gross per 24 hour  Intake 1206.18 ml  Output 2225 ml  Net -1018.82 ml   Last 3 Weights 04/05/2018  Weight (lbs) 186 lb 11.7 oz  Weight (kg) 84.7 kg      Telemetry    Sinus rhythm- Personally Reviewed  ECG    None new- Personally Reviewed  Physical Exam   GEN: No acute distress.   Neck: No JVD Cardiac: RRR, no murmurs, rubs, or gallops.  Respiratory: Clear to auscultation bilaterally. GI: Soft, nontender, non-distended  MS: No edema; No deformity. Neuro:  Nonfocal  Psych: Normal affect   Labs    Chemistry Recent Labs  Lab 04/05/18 0800  NA 133*  K 4.3  CL 108  CO2 17*  GLUCOSE 108*  BUN 11  CREATININE 0.92  CALCIUM 8.5*  PROT 6.3*  ALBUMIN 3.3*  AST 19  ALT 16  ALKPHOS 51  BILITOT 1.1  GFRNONAA >60  GFRAA >60  ANIONGAP 8     Hematology Recent Labs  Lab 04/05/18 0800  WBC 11.0*  RBC 4.59  HGB 14.1  HCT 42.4  MCV 92.4  MCH 30.7  MCHC 33.3  RDW 13.2  PLT 172    Cardiac EnzymesNo results for input(s):  TROPONINI in the last 168 hours. No results for input(s): TROPIPOC in the last 168 hours.   BNPNo results for input(s): BNP, PROBNP in the last 168 hours.   DDimer No results for input(s): DDIMER in the last 168 hours.   Radiology    No results found.  Cardiac Studies   TTE  1. The left ventricle has normal systolic function with an ejection fraction of 60-65%. The cavity size was normal. There is mildly increased left ventricular wall thickness. Left ventricular diastolic Doppler parameters are consistent with  pseudonormalization.  2. The right ventricle has normal systolic function. The cavity was normal. There is no increase in right ventricular wall thickness.  3. The mitral valve is grossly normal. Moderate thickening of the mitral valve leaflet.  4. The aortic valve is grossly normal Mild thickening of the aortic valve no stenosis of the aortic valve.  5. The interatrial septum was not assessed.  Patient Profile     73 y.o. male with a history of hypertension and prediabetes who presented to the hospital after episodes of syncope.  Assessment & Plan  1.  Syncope: At this point it is unclear to me as to the cause of his syncope.  He did have episodes of syncope while in the hospital when he became bradycardic which per report was a vagal response.  This was after he was having quite a bit of discomfort from his rib fractures.  At this point, it is unclear to me what happened and why he was syncopal or near syncopal at home.  We Jerome Houston continue to watch him on telemetry overnight.  Should he have no further episodes, he may benefit from cardiac monitoring as an outpatient.  2.  Rib fracture status post fall: Plan per trauma surgery.  They are planning on x-ray today.  3.  Hypertension: Is on lisinopril at home.  Jerome Houston restart today.  4.  Alcohol history: Has gotten thiamine and folate supplementation.  No signs of withdrawal.  For questions or updates, please contact [place] Please consult [email or web address] for contact info under        Signed, Jerome Coste Meredith Leeds, MD  04/06/2018, 10:16 AM

## 2018-04-06 NOTE — Consult Note (Signed)
[facility] Nurse wound consult note Reason for Consult: left shoulder full thickness wound Wound type: etiology not known or confirmed, but presentation consistent with dermatologic or neoplastic lesion Pressure Injury POA: Yes Measurement:4.5cm x 4cm area of well demarcated erythema and mild elevation with scattered friable areas within, bleeds easily Wound bed:As described above Drainage (amount, consistency, odor) serosanguinous Periwound:intact, dry Dressing procedure/placement/frequency: I will provide Nursing with guidance for conservative care using xeroform gauze once daily. If you agree, p[lease refer to dermatology (or PCP) upon discharge for biopsy, definitive diagnosis and POC.  [facility] nursing team will not follow, but will remain available to this patient, the nursing and medical teams.  Please re-consult if needed. Thanks, Maudie Flakes, MSN, RN, [place], Arther Abbott  Pager# [phone]

## 2018-04-07 ENCOUNTER — Other Ambulatory Visit: Payer: Self-pay | Admitting: Physician Assistant

## 2018-04-07 ENCOUNTER — Other Ambulatory Visit: Payer: Self-pay

## 2018-04-07 ENCOUNTER — Encounter (HOSPITAL_COMMUNITY): Payer: Self-pay

## 2018-04-07 DIAGNOSIS — R55 Syncope and collapse: Secondary | ICD-10-CM

## 2018-04-07 MED ORDER — TRAMADOL HCL 50 MG PO TABS: 50.0000 mg | ORAL_TABLET | Freq: Three times a day (TID) | ORAL | 0 refills | Status: DC | PRN

## 2018-04-07 MED ORDER — TRAMADOL HCL 50 MG PO TABS
100.0000 mg | ORAL_TABLET | Freq: Four times a day (QID) | ORAL | Status: DC
  Administered 2018-04-07: 100 mg via ORAL
  Filled 2018-04-07: qty 2

## 2018-04-07 MED ORDER — HYDROCODONE-ACETAMINOPHEN 5-325 MG PO TABS: 1.0000 | ORAL_TABLET | Freq: Four times a day (QID) | ORAL | Status: DC | PRN

## 2018-04-07 NOTE — Final Consult Note (Signed)
Consultant Final Sign-Off Note    Assessment/Final recommendations  Jerome Houston is a 73 y.o. male followed by me for Fall with L rib FX 7-11, chest xray yesterday showed Low volumes with nonspecific bibasilar opacities without PTX.    Wound care (if applicable):    Diet at discharge: per primary team   Activity at discharge: per primary team   Follow-up appointment:  None needed, PCP   Pending results:  Unresulted Labs (From admission, onward)   None       Medication recommendations: none   Other recommendations: increased tramadol and added hydrocodone for better pain control. Pt needs to continue to use incentive spirometer 3-4 times q72min.     Thank you for allowing [place] to participate in the care of your patient!  Please consult [place] again if you have further needs for your patient.  Kalman Drape 04/07/2018 8:15 AM    Subjective   CC: left posterior rib pain  No issues overnight. He is trying not to cough cause it hurts. He states he has been pulling 2000cc on IS but today he only pulled 500cc for me. He states it hurts when he takes deep breaths. He states the pain regiment is controlling his pain. No fever or chills. Pt lives with wife and she is not at bedside.   Objective  Vital signs in last 24 hours: Temp:  [98 F (36.7 C)-98.6 F (37 C)] 98 F (36.7 C) (03/23 0619) Pulse Rate:  [71-79] 77 (03/23 0619) Resp:  [18] 18 (03/23 0619) BP: (138-150)/(82-103) 138/82 (03/23 0619) SpO2:  [94 %-96 %] 95 % (03/23 0619) Weight:  [82.3 kg] 82.3 kg (03/23 0619)  PE: Gen: Awake and alert Lungs: mildly diminished breath sounds L base. No rales, crackles or rhonchi noted.  Normal rate and effort. Pulling 500cc on IS.  Chest wall: Tenderness over left lateral chest wall. No crepitus.     Pertinent labs and Studies: Recent Labs    04/05/18 0800  WBC 11.0*  HGB 14.1  HCT 42.4   BMET Recent Labs    04/05/18 0800  NA 133*  K 4.3  CL 108  CO2 17*  GLUCOSE  108*  BUN 11  CREATININE 0.92  CALCIUM 8.5*   No results for input(s): LABURIN in the last 72 hours. Results for orders placed or performed during the hospital encounter of 04/05/18  MRSA PCR Screening     Status: None   Collection Time: 04/05/18  3:44 AM  Result Value Ref Range Status   MRSA by PCR NEGATIVE NEGATIVE Final    Comment:        The GeneXpert MRSA Assay (FDA approved for NASAL specimens only), is one component of a comprehensive MRSA colonization surveillance program. It is not intended to diagnose MRSA infection nor to guide or monitor treatment for MRSA infections. Performed at [facility], [place] [address] [place], [place] [address]     Imaging: Dg Chest Port 1 View  Result Date: 04/06/2018 CLINICAL DATA:  Reason for exam: L Rib fractures, pt fell after passing out on Friday. No chest pain today. Hx of HTN. EXAM: PORTABLE CHEST - 1 VIEW COMPARISON:  06/28/2005 FINDINGS: Low volumes. Coarse linear scarring, atelectasis or infiltrate at the lung bases. No overt edema. Heart size upper limits normal for technique. Blunting of the left lateral costophrenic angle.  No pneumothorax. Possible fracture deformity of the anterolateral aspect left seventh rib, age indeterminate. IMPRESSION: Low volumes with nonspecific bibasilar opacities  Electronically Signed   By: Lucrezia Europe M.D.   On: 04/06/2018 10:35

## 2018-04-07 NOTE — Discharge Summary (Addendum)
DISCHARGE SUMMARY    Patient ID: Jerome Houston,  MRN: [id number], DOB/AGE: 1945-04-20 73 y.o.  Admit date: 04/05/2018 Discharge date: 04/07/2018  Primary Care Physician: Jerome Houston  Primary Cardiologist/Electrophysiologist: Jerome Houston  Primary Discharge Diagnosis:  1. Syncope/near syncope 2. Transient Bradycardia  Secondary Discharge Diagnosis:  1. HTN  Allergies  Allergen Reactions  . Sulfur Other (See Comments)    Parents told him. Not sure of reaction     Procedures This Admission:  none   Brief HPI: Jerome Houston is a 73 y.o. male with PMHx of HTN, preDM, admitted from [facility], after a syncopal event, rib fractures of 5th - 8th rib on the L side. Was evaluated by trauma surgery and deemed high risk for pulmonary complications, for which he was planned for transfer and admission to [facility].   Hospital Course:  The patient/family reported was in his usual state of health until yesterday afternoon. While seated at his sons house, wife noted that patient began to look "unwell". He arose from a chair and started walking quickly to the bathroom when she heard a noise and found him on the floor. He denies antecedent chest pain or tightness, did not feel dizzy or lightheaded, but remembers feeling a strong urge to urinate. When he came to, his wife noted that he had urinated on himself. Later that evening while at home he was on the toilet urinating and suddenly became lightheaded, he feel off the toilet striking his L side on the tub, prompting his [facility] ER visit.   While in the ER, however, patient had two episodes of bradycardia to the 40s. EKG sent from [place] mostly [place] but not rhythm strip. No telemetry available for review. During both episodes patients unresponsive with drop in BP documented to 80s/60s. He reportedly responded to atropine. Per his wife, she noted that his HR would drop slowly on the monitors (80s-> 60s > 50s -> 40s) around the time of  these episodes and that he would lose his color and complain of nausea. He rated his pain around these episodes as ~8/10.  He was treated with IVF, planned for dopamine if recurrent or unrecovered HR.  EP was called to the case.  TTE noted LVEF 60-65%, no significant VHD.  He was not felt to have any obvious signs of ischemia, no symptoms of CAD.  Planned to monitor on telemetry 24-48 hours with suspicion that the episodes may have been vagally mediated.  Trauma service followed, no pneumothorax noted by CXR.  They signed off the case today recommended continued IS use and pain management.  The patient has not had any further bradycardia noted on telemetry. The patient today reports that his rib pain is improved, and that the pain management he has been getting has been sufficient and able to take deep breaths and use his IS.  He does 1070ml for me.   Jerome Houston send a 5 day supply of tramadol 50mg  Q8 PRN.   Is instructed to take IS home with him and use every hour while awake.  We discussed narcotic safety.  The patient is instructed no driving until cleared to by Dr. Curt Bears.  Zio patch AT is ordered and Jerome Houston be delivered to his home.  Jerome Houston plan follow up with Dr. Charmaine Downs (likely tele-visit) for the next 3-4 weeks.  The patient is instructed to seek attention/notify of any recurrent near syncope or syncope.  The patient was examined and considered stable for discharge to  home.    Physical Exam: Vitals:   04/06/18 1455 04/06/18 1652 04/06/18 2143 04/07/18 0619  BP: (!) 149/103 (!) 148/102 (!) 150/100 138/82  Pulse: 74 71 79 77  Resp:    18  Temp: 98.2 F (36.8 C)  98.6 F (37 C) 98 F (36.7 C)  TempSrc: Oral  Oral Oral  SpO2: 96% 94% 94% 95%  Weight:    82.3 kg  Height:        GEN- The patient is well appearing, alert and oriented x 3 today.   HEENT: normocephalic, atraumatic; sclera clear, conjunctiva pink; hearing intact; oropharynx clear; neck supple, no JVP Lungs- CTA b/l,  normal work of  breathing.  No wheezes, rales, rhonchi Heart- RRR, no murmurs, rubs or gallops, PMI not laterally displaced GI- soft, non-tender, non-distended Extremities- no clubbing, cyanosis, or edema MS- no significant deformity or atrophy Skin- warm and dry, no rash or lesion Psych- euthymic mood, full affect Neuro- no gross deficits   Labs:   Lab Results  Component Value Date   WBC 11.0 (H) 04/05/2018   HGB 14.1 04/05/2018   HCT 42.4 04/05/2018   MCV 92.4 04/05/2018   PLT 172 04/05/2018    Recent Labs  Lab 04/05/18 0800  NA 133*  K 4.3  CL 108  CO2 17*  BUN 11  CREATININE 0.92  CALCIUM 8.5*  PROT 6.3*  BILITOT 1.1  ALKPHOS 51  ALT 16  AST 19  GLUCOSE 108*    Discharge Medications:  Allergies as of 04/07/2018      Reactions   Sulfur Other (See Comments)   Parents told him. Not sure of reaction      Medication List    TAKE these medications   aspirin EC 81 MG tablet Take 81 mg by mouth daily.   lisinopril 20 MG tablet Commonly known as:  PRINIVIL,ZESTRIL Take 20 mg by mouth daily.   traMADol 50 MG tablet Commonly known as:  ULTRAM Take 1 tablet (50 mg total) by mouth every 8 (eight) hours as needed for severe pain.       Disposition: Home Discharge Instructions    Diet - low sodium heart healthy   Complete by:  As directed    Increase activity slowly   Complete by:  As directed      Follow-up Information    Jerome Haw, MD Follow up.   Specialty:  Cardiology Why:  05/12/2018 @ 12:00PM (noon) Contact information: [place] [address] [phone]           Duration of Discharge Encounter: Greater than 30 minutes including physician time.  Jerome Night, PA-C 04/07/2018 12:46 PM   I have seen and examined this patient with Jerome Houston.  Agree with above, note added to reflect my findings.  On exam, RRR, no murmurs, lungs clear. Patient had multiple episodes of syncope vs near syncope with a fall and rib  fractures. Had bradycardia in the ER associated with syncope but episode on ECG appears to be vagal potentially due to pain. No further evidence of bradycardia on telemetry for 48 hours. Jerome Houston plan for outpatient monitor. Patient advised of no driving for 6 months.  Bethzy Hauck M. Shawnise Peterkin MD 04/07/2018 4:33 PM

## 2018-04-07 NOTE — Discharge Instructions (Signed)
You should receive your heart monitor in the next few days (3-4days) in the mail.  Please call Dr. Macky Lower office if you do not get it.  Use your incentive spirometer every hour to exercise deep breathing  NO DRIVING UNTIL CLEARED TO BY DR. Curt Bears

## 2018-04-08 ENCOUNTER — Encounter: Payer: Self-pay | Admitting: *Deleted

## 2018-04-08 ENCOUNTER — Telehealth: Payer: Self-pay | Admitting: Physician Assistant

## 2018-04-08 LAB — GLUCOSE, CAPILLARY
Glucose-Capillary: 118 mg/dL — ABNORMAL HIGH (ref 70–99)
Glucose-Capillary: 133 mg/dL — ABNORMAL HIGH (ref 70–99)

## 2018-04-08 NOTE — Telephone Encounter (Signed)
Called patient to follow up on pund care RN note, not addressed at the time of his discharge.  The patient is doing well, reports his rib pain controlled to tolerable, and able to ambulate, using his IS hourly while awake.  He reports having a "good BM" today and feels well.  NO near syncope or syncope.  He tells me the shoulder wound has been there for years, nonpainful, has never had it formally checked.  He reports that it will scab over, and intermittently the scab falls off and grows back.  He thinks has been ths same size for years as well.  He does see his PMD service as needed at [facility] clinic.  He is urged to call and make an appointment to have this evaluated by his PMD to see about care and further evaluation.  He reports he will.  Discussed his heart monitor should be delivered in the next couple days and if he does not get it to reach out to our office.  He appreciated the follow up, is encouraged to call if any concerns or symptoms.  Tommye Standard, PA-C

## 2018-04-08 NOTE — Progress Notes (Signed)
Patient ID: Jerome Houston, male   DOB: 1946/01/12, 73 y.o.   MRN: [id number] Patient enrolled for [employer] to mail a 14 day ZIO AT (event monitor) to the patients home.

## 2018-04-10 ENCOUNTER — Ambulatory Visit (INDEPENDENT_AMBULATORY_CARE_PROVIDER_SITE_OTHER): Payer: Medicare HMO

## 2018-04-10 DIAGNOSIS — R55 Syncope and collapse: Secondary | ICD-10-CM

## 2018-04-23 ENCOUNTER — Other Ambulatory Visit: Payer: Self-pay

## 2018-05-05 ENCOUNTER — Telehealth: Payer: Self-pay | Admitting: *Deleted

## 2018-05-05 NOTE — Telephone Encounter (Signed)
Virtual Visit Pre-Appointment Phone Call  Steps For Call:  1. Confirm consent - "In the setting of the current Covid19 crisis, you are scheduled for a (phone or video) visit with your provider on (date) at (time).  Just as we do with many in-office visits, in order for you to participate in this visit, we must obtain consent.  If you'd like, I can send this to your mychart (if signed up) or email for you to review.  Otherwise, I can obtain your verbal consent now.  All virtual visits are billed to your insurance company just like a normal visit would be.  By agreeing to a virtual visit, we'd like you to understand that the technology does not allow for your provider to perform an examination, and thus may limit your provider's ability to fully assess your condition. If your provider identifies any concerns that need to be evaluated in person, we will make arrangements to do so.  Finally, though the technology is pretty good, we cannot assure that it will always work on either your or our end, and in the setting of a video visit, we may have to convert it to a phone-only visit.  In either situation, we cannot ensure that we have a secure connection.  Are you willing to proceed?" STAFF: Did the patient verbally acknowledge consent to telehealth visit? Document YES/NO here: YES  2. Confirm the BEST phone number to call the day of the visit by including in appointment notes  3. Give patient instructions for MyChart download to smartphone OR [email or web address] as below if video visit (depending on what platform provider is using)  4. Confirm that appointment type is correct in Epic appointment notes (VIDEO vs PHONE)  5. Advise patient to be prepared with their blood pressure, heart rate, weight, any heart rhythm information, their current medicines, and a piece of paper and pen handy for any instructions they may receive the day of their visit  6. Inform patient they will receive a phone call 15 minutes  prior to their appointment time (may be from unknown caller ID) so they should be prepared to answer    TELEPHONE CALL NOTE  Jerome Houston has been deemed a candidate for a follow-up tele-health visit to limit community exposure during the Covid-19 pandemic. I spoke with the patient via phone to ensure availability of phone/video source, confirm preferred email & phone number, and discuss instructions and expectations.  I reminded Jerome Houston to be prepared with any vital sign and/or heart rhythm information that could potentially be obtained via home monitoring, at the time of his visit. I reminded Jerome Houston to expect a phone call prior to his visit.  Jerome Houston 05/05/2018 4:36 PM   INSTRUCTIONS FOR DOWNLOADING THE MYCHART APP TO SMARTPHONE  - The patient must first make sure to have activated MyChart and know their login information - If Apple, go to [employer] and type in MyChart in the search bar and download the app. If Android, ask patient to go to [employer] and type in Alpha in the search bar and download the app. The app is free but as with any other app downloads, their phone may require them to verify saved payment information or Apple/Android password.  - The patient will need to then log into the app with their MyChart username and password, and select [facility] as their healthcare provider to link the account. When it is time for your visit, go to the [facility]  app, find appointments, and click Begin Video Visit. Be sure to Select Allow for your device to access the Microphone and Camera for your visit. You will then be connected, and your provider will be with you shortly.  **If they have any issues connecting, or need assistance please contact MyChart service desk [phone])**  **If using a computer, in order to ensure the best quality for their visit they will need to use either of the following Internet Browsers: [employer], or Google Chrome**   IF USING DOXIMITY or DOXY.ME - The patient will receive a link just prior to their visit by text.     FULL LENGTH CONSENT FOR TELE-HEALTH VISIT   I hereby voluntarily request, consent and authorize [facility] and its employed or contracted physicians, physician assistants, nurse practitioners or other licensed health care professionals (the Practitioner), to provide me with telemedicine health care services (the "Services") as deemed necessary by the treating Practitioner. I acknowledge and consent to receive the Services by the Practitioner via telemedicine. I understand that the telemedicine visit will involve communicating with the Practitioner through live audiovisual communication technology and the disclosure of certain medical information by electronic transmission. I acknowledge that I have been given the opportunity to request an in-person assessment or other available alternative prior to the telemedicine visit and am voluntarily participating in the telemedicine visit.  I understand that I have the right to withhold or withdraw my consent to the use of telemedicine in the course of my care at any time, without affecting my right to future care or treatment, and that the Practitioner or I may terminate the telemedicine visit at any time. I understand that I have the right to inspect all information obtained and/or recorded in the course of the telemedicine visit and may receive copies of available information for a reasonable fee.  I understand that some of the potential risks of receiving the Services via telemedicine include:  Marland Kitchen Delay or interruption in medical evaluation due to technological equipment failure or disruption; . Information transmitted may not be sufficient (e.g. poor resolution of images) to allow for appropriate medical decision making by the Practitioner; and/or  . In rare instances, security protocols could fail, causing a breach of personal health information.   Furthermore, I acknowledge that it is my responsibility to provide information about my medical history, conditions and care that is complete and accurate to the best of my ability. I acknowledge that Practitioner's advice, recommendations, and/or decision may be based on factors not within their control, such as incomplete or inaccurate data provided by me or distortions of diagnostic images or specimens that may result from electronic transmissions. I understand that the practice of medicine is not an exact science and that Practitioner makes no warranties or guarantees regarding treatment outcomes. I acknowledge that I will receive a copy of this consent concurrently upon execution via email to the email address I last provided but may also request a printed copy by calling the office of [place].    I understand that my insurance will be billed for this visit.   I have read or had this consent read to me. . I understand the contents of this consent, which adequately explains the benefits and risks of the Services being provided via telemedicine.  . I have been provided ample opportunity to ask questions regarding this consent and the Services and have had my questions answered to my satisfaction. . I give my informed consent for the services  to be provided through the use of telemedicine in my medical care  By participating in this telemedicine visit I agree to the above. Verbal Consent Given

## 2018-05-12 ENCOUNTER — Telehealth (INDEPENDENT_AMBULATORY_CARE_PROVIDER_SITE_OTHER): Payer: Medicare HMO | Admitting: Cardiology

## 2018-05-12 ENCOUNTER — Encounter: Payer: Self-pay | Admitting: Cardiology

## 2018-05-12 ENCOUNTER — Other Ambulatory Visit: Payer: Self-pay

## 2018-05-12 DIAGNOSIS — R55 Syncope and collapse: Secondary | ICD-10-CM | POA: Diagnosis not present

## 2018-05-12 NOTE — Progress Notes (Signed)
Electrophysiology TeleHealth Note   Due to national recommendations of social distancing due to COVID 19, an audio/video telehealth visit is felt to be most appropriate for this patient at this time.  See Epic message for the patient's consent to telehealth for [facility].   Date:  05/12/2018   ID:  Jerome Houston, DOB 25-May-1945, MRN [id number]  Location: patient's home  Provider location: [address], [place]  Evaluation Performed: Follow-up visit  PCP:  Earlyne Iba, NP  Cardiologist:  No primary care provider on file.  Electrophysiologist:  Dr Curt Bears  Chief Complaint:  syncope  History of Present Illness:    Jerome Houston is a 73 y.o. male who presents via audio/video conferencing for a telehealth visit today.  Since last being seen in our clinic, the patient reports doing very well.  Today, he denies symptoms of palpitations, chest pain, shortness of breath,  lower extremity edema, dizziness, presyncope, or syncope.  The patient is otherwise without complaint today.  The patient denies symptoms of fevers, chills, cough, or new SOB worrisome for COVID 19.  He has a history of hypertension and an episode of syncope.  He had syncope in March 2020 after falling in the bathroom.  He fell and hit his ribs.  The emergency room, he was noted to have severe pain as well as what appears to be sinus bradycardia.  It was thought this was potentially due to a vagal response.  He was also orthostatic in the emergency room.  Today, denies symptoms of palpitations, chest pain, shortness of breath, orthopnea, PND, lower extremity edema, claudication, dizziness, presyncope, syncope, bleeding, or neurologic sequela. The patient is tolerating medications without difficulties. He has had no further episodes of syncope.  Has had no chest pain or shortness of breath. He has been exercising without issues. He has been much more active since his rib pain improved.   Past Medical  History:  Diagnosis Date  . Hypertension   . Prediabetes     Past Surgical History:  Procedure Laterality Date  . HERNIA REPAIR      Current Outpatient Medications  Medication Sig Dispense Refill  . aspirin EC 81 MG tablet Take 81 mg by mouth daily.    Marland Kitchen lisinopril (PRINIVIL,ZESTRIL) 20 MG tablet Take 20 mg by mouth daily.    . traMADol (ULTRAM) 50 MG tablet Take 1 tablet (50 mg total) by mouth every 8 (eight) hours as needed for severe pain. (Patient not taking: Reported on 05/12/2018) 15 tablet 0   No current facility-administered medications for this visit.     Allergies:   Sulfur   Social History:  The patient  reports that he quit smoking about 60 years ago. His smoking use included cigarettes. He has never used smokeless tobacco. He reports previous alcohol use. He reports previous drug use.   Family History:  The patient's  family history includes Coronary artery disease in his father and mother.   ROS:  Please see the history of present illness.   All other systems are personally reviewed and negative.    Exam:    Vital Signs:  There were no vitals taken for this visit.  Over the phone, no acute distress, no shortness of breath.  Labs/Other Tests and Data Reviewed:    Recent Labs: 04/05/2018: ALT 16; BUN 11; Creatinine, Ser 0.92; Hemoglobin 14.1; Magnesium 1.8; Platelets 172; Potassium 4.3; Sodium 133   Wt Readings from Last 3 Encounters:  04/07/18 181 lb  8 oz (82.3 kg)     Other studies personally reviewed: Additional studies/ records that were reviewed today include: TTE 04/05/18  Review of the above records today demonstrates:    1. The left ventricle has normal systolic function with an ejection fraction of 60-65%. The cavity size was normal. There is mildly increased left ventricular wall thickness. Left ventricular diastolic Doppler parameters are consistent with  pseudonormalization.  2. The right ventricle has normal systolic function. The cavity was  normal. There is no increase in right ventricular wall thickness.  3. The mitral valve is grossly normal. Moderate thickening of the mitral valve leaflet.  4. The aortic valve is grossly normal Mild thickening of the aortic valve no stenosis of the aortic valve.  5. The interatrial septum was not assessed.  Monitor 04/29/18 personally reviewed Max 235 bpm 06:44pm, 03/28 Min 43 bpm 06:55am, 04/02 Avg 70 bpm Sinus rhythm Short runs of SVT, longest 11 beats  ECG 04/05/18 personally reviewed SR, rate 59, RBBB  ASSESSMENT & PLAN:    1.  Syncope: Did have an episode of transient bradycardia while in the emergency room.  This did occur while he was dealing with pain from his rib injury.  He wore a cardiac monitor that showed no evidence of bradycardia.  He did have short episodes of SVT up to 11 beats.  At this point, I am unclear as to the cause of his syncope. I discussed LINQ monitoring with him but he is not yet ready for this. Brendolyn Stockley see him back at the end of the summer to discuss further. Reminded of no driving per [place] law.  2.  Hypertension: Continue lisinopril   COVID 19 screen The patient denies symptoms of COVID 19 at this time.  The importance of social distancing was discussed today.  Follow-up:  4 months   Current medicines are reviewed at length with the patient today.   The patient does not have concerns regarding his medicines.  The following changes were made today:    Labs/ tests ordered today include:  No orders of the defined types were placed in this encounter.  This visit was done over the phone. The patient does not have a smart phone or access to a computer.  Patient Risk:  after full review of this patients clinical status, I feel that they are at moderate risk at this time.  Today, I have spent 12 minutes with the patient with telehealth technology discussing syncope .    Signed, Alanya Vukelich Meredith Leeds, MD  05/12/2018 11:46 AM     [facility] [address] [place] [address] [phone] (office) [phone] (fax)

## 2018-08-25 ENCOUNTER — Ambulatory Visit (INDEPENDENT_AMBULATORY_CARE_PROVIDER_SITE_OTHER): Payer: Medicare HMO | Admitting: Cardiology

## 2018-08-25 ENCOUNTER — Other Ambulatory Visit: Payer: Self-pay

## 2018-08-25 ENCOUNTER — Encounter: Payer: Self-pay | Admitting: Cardiology

## 2018-08-25 VITALS — BP 142/84 | HR 78 | Ht 72.0 in | Wt 189.0 lb

## 2018-08-25 DIAGNOSIS — R55 Syncope and collapse: Secondary | ICD-10-CM

## 2018-08-25 MED ORDER — LISINOPRIL 40 MG PO TABS: 40.0000 mg | ORAL_TABLET | Freq: Every day | ORAL | 1 refills | Status: DC

## 2018-08-25 NOTE — Patient Instructions (Signed)
Medication Instructions:  Your physician has recommended you make the following change in your medication:  1. INCREASE Lisinopril to 40 mg once daily  * If you need a refill on your cardiac medications before your next appointment, please call your pharmacy.   Labwork: None ordered  Testing/Procedures: None ordered  Follow-Up: No follow up is needed at this time with Dr. Curt Bears.  He will see you on an as needed basis.   Thank you for choosing [facility]!!   Trinidad Curet, RN [phone]

## 2018-08-25 NOTE — Progress Notes (Signed)
Electrophysiology Office Note   Date:  08/25/2018   ID:  Jerome Houston, DOB Apr 24, 1945, MRN [id number]  PCP:  Earlyne Iba, NP  Cardiologist:   Primary Electrophysiologist:  Kobe Jansma Meredith Leeds, MD    No chief complaint on file.    History of Present Illness: Jerome Houston is a 73 y.o. male who is being seen today for the evaluation of syncope at the request of Potts, Georgeann Oppenheim, NP. Presenting today for electrophysiology evaluation.  He has a history of hypertension, prediabetes, and syncope.  Syncope occurred after a fall in the bathroom.  He hit his ribs.  He was noted to have sinus bradycardia at the time.  He wore a cardiac monitor that showed short episodes of SVT.  Today, he denies symptoms of palpitations, chest pain, shortness of breath, orthopnea, PND, lower extremity edema, claudication, dizziness, presyncope, syncope, bleeding, or neurologic sequela. The patient is tolerating medications without difficulties.    Past Medical History:  Diagnosis Date  . Hypertension   . Prediabetes    Past Surgical History:  Procedure Laterality Date  . HERNIA REPAIR       Current Outpatient Medications  Medication Sig Dispense Refill  . aspirin EC 81 MG tablet Take 81 mg by mouth daily.    Marland Kitchen lisinopril (ZESTRIL) 40 MG tablet Take 1 tablet (40 mg total) by mouth daily. Please make sure patient understands these are 40 mg tablets and only take one daily.  Future refills Stevie Ertle need to come from his PCP. 90 tablet 1   No current facility-administered medications for this visit.     Allergies:   Sulfur   Social History:  The patient  reports that he quit smoking about 60 years ago. His smoking use included cigarettes. He has never used smokeless tobacco. He reports previous alcohol use. He reports previous drug use.   Family History:  The patient's family history includes Coronary artery disease in his father and mother.    ROS:  Please see the history of present illness.    Otherwise, review of systems is positive for none.   All other systems are reviewed and negative.    PHYSICAL EXAM: VS:  BP (!) 142/84   Pulse 78   Ht 6' (1.829 m)   Wt 189 lb (85.7 kg)   BMI 25.63 kg/m  , BMI Body mass index is 25.63 kg/m. GEN: Well nourished, well developed, in no acute distress  HEENT: normal  Neck: no JVD, carotid bruits, or masses Cardiac: RRR; no murmurs, rubs, or gallops,no edema  Respiratory:  clear to auscultation bilaterally, normal work of breathing GI: soft, nontender, nondistended, + BS MS: no deformity or atrophy  Skin: warm and dry Neuro:  Strength and sensation are intact Psych: euthymic mood, full affect  EKG:  EKG is ordered today. Personal review of the ekg ordered shows sinus rhythm, right bundle branch block  Recent Labs: 04/05/2018: ALT 16; BUN 11; Creatinine, Ser 0.92; Hemoglobin 14.1; Magnesium 1.8; Platelets 172; Potassium 4.3; Sodium 133    Lipid Panel  No results found for: CHOL, TRIG, HDL, CHOLHDL, VLDL, LDLCALC, LDLDIRECT   Wt Readings from Last 3 Encounters:  08/25/18 189 lb (85.7 kg)  04/07/18 181 lb 8 oz (82.3 kg)      Other studies Reviewed: Additional studies/ records that were reviewed today include: TTE 04/05/18  Review of the above records today demonstrates:   1. The left ventricle has normal systolic function with an ejection fraction of 60-65%. The  cavity size was normal. There is mildly increased left ventricular wall thickness. Left ventricular diastolic Doppler parameters are consistent with  pseudonormalization.  2. The right ventricle has normal systolic function. The cavity was normal. There is no increase in right ventricular wall thickness.  3. The mitral valve is grossly normal. Moderate thickening of the mitral valve leaflet.  4. The aortic valve is grossly normal Mild thickening of the aortic valve no stenosis of the aortic valve.  5. The interatrial septum was not assessed.  Monitor 04/10/18 personally  reviewed Max 235 bpm 06:44pm, 03/28 Min 43 bpm 06:55am, 04/02 Avg 70 bpm Sinus rhythm Short runs of SVT, longest 11 beats  ASSESSMENT AND PLAN:  1.  Syncope: Had transient bradycardia while in the emergency room.  He wore a cardiac monitor that showed minimal bradycardia and short episodes of SVT.  No further episodes of syncope.  We Kerin Kren see on an as-needed basis.  2.  Hypertension: Mildly elevated today.  Gad Aymond increase lisinopril to 40 mg.  3.  Skin lesion: Note located on the back of his neck.  I have asked him to have his primary physician look at it when he sees her next week.    Current medicines are reviewed at length with the patient today.   The patient does not have concerns regarding his medicines.  The following changes were made today: Increase lisinopril  Labs/ tests ordered today include:  No orders of the defined types were placed in this encounter.    Disposition:   FU with Harleigh Civello PRN  Signed, Brayton Baumgartner Meredith Leeds, MD  08/25/2018 9:14 AM     [facility] [address] [place] [address] [phone] (office) [phone] (fax)

## 2018-09-01 DIAGNOSIS — R739 Hyperglycemia, unspecified: Secondary | ICD-10-CM | POA: Diagnosis not present

## 2018-09-01 DIAGNOSIS — R972 Elevated prostate specific antigen [PSA]: Secondary | ICD-10-CM | POA: Diagnosis not present

## 2018-09-01 DIAGNOSIS — E785 Hyperlipidemia, unspecified: Secondary | ICD-10-CM | POA: Diagnosis not present

## 2018-09-01 DIAGNOSIS — I10 Essential (primary) hypertension: Secondary | ICD-10-CM | POA: Diagnosis not present

## 2018-09-30 DIAGNOSIS — E785 Hyperlipidemia, unspecified: Secondary | ICD-10-CM | POA: Diagnosis not present

## 2018-09-30 DIAGNOSIS — Z125 Encounter for screening for malignant neoplasm of prostate: Secondary | ICD-10-CM | POA: Diagnosis not present

## 2018-09-30 DIAGNOSIS — Z1211 Encounter for screening for malignant neoplasm of colon: Secondary | ICD-10-CM | POA: Diagnosis not present

## 2018-09-30 DIAGNOSIS — Z1331 Encounter for screening for depression: Secondary | ICD-10-CM | POA: Diagnosis not present

## 2018-09-30 DIAGNOSIS — Z9181 History of falling: Secondary | ICD-10-CM | POA: Diagnosis not present

## 2018-09-30 DIAGNOSIS — Z0000 Encounter for general adult medical examination without abnormal findings: Secondary | ICD-10-CM | POA: Diagnosis not present

## 2018-10-30 DIAGNOSIS — E785 Hyperlipidemia, unspecified: Secondary | ICD-10-CM | POA: Diagnosis not present

## 2018-11-04 DIAGNOSIS — R972 Elevated prostate specific antigen [PSA]: Secondary | ICD-10-CM | POA: Diagnosis not present

## 2018-11-04 DIAGNOSIS — N401 Enlarged prostate with lower urinary tract symptoms: Secondary | ICD-10-CM | POA: Diagnosis not present

## 2018-11-18 DIAGNOSIS — C61 Malignant neoplasm of prostate: Secondary | ICD-10-CM | POA: Diagnosis not present

## 2018-11-18 DIAGNOSIS — N401 Enlarged prostate with lower urinary tract symptoms: Secondary | ICD-10-CM | POA: Diagnosis not present

## 2018-11-18 DIAGNOSIS — R972 Elevated prostate specific antigen [PSA]: Secondary | ICD-10-CM | POA: Diagnosis not present

## 2018-11-25 DIAGNOSIS — C61 Malignant neoplasm of prostate: Secondary | ICD-10-CM | POA: Diagnosis not present

## 2018-11-25 DIAGNOSIS — R972 Elevated prostate specific antigen [PSA]: Secondary | ICD-10-CM | POA: Diagnosis not present

## 2018-12-04 ENCOUNTER — Other Ambulatory Visit (HOSPITAL_COMMUNITY): Payer: Self-pay | Admitting: Urology

## 2018-12-04 DIAGNOSIS — C61 Malignant neoplasm of prostate: Secondary | ICD-10-CM

## 2018-12-05 DIAGNOSIS — C61 Malignant neoplasm of prostate: Secondary | ICD-10-CM | POA: Diagnosis not present

## 2018-12-05 DIAGNOSIS — R972 Elevated prostate specific antigen [PSA]: Secondary | ICD-10-CM | POA: Diagnosis not present

## 2018-12-05 DIAGNOSIS — R948 Abnormal results of function studies of other organs and systems: Secondary | ICD-10-CM | POA: Diagnosis not present

## 2018-12-09 ENCOUNTER — Ambulatory Visit (HOSPITAL_COMMUNITY): Admission: RE | Admit: 2018-12-09 | Payer: Medicare HMO | Source: Ambulatory Visit

## 2018-12-09 DIAGNOSIS — Z2821 Immunization not carried out because of patient refusal: Secondary | ICD-10-CM | POA: Diagnosis not present

## 2018-12-09 DIAGNOSIS — I10 Essential (primary) hypertension: Secondary | ICD-10-CM | POA: Diagnosis not present

## 2018-12-09 DIAGNOSIS — Z23 Encounter for immunization: Secondary | ICD-10-CM | POA: Diagnosis not present

## 2018-12-09 DIAGNOSIS — R739 Hyperglycemia, unspecified: Secondary | ICD-10-CM | POA: Diagnosis not present

## 2018-12-09 DIAGNOSIS — C61 Malignant neoplasm of prostate: Secondary | ICD-10-CM | POA: Diagnosis not present

## 2018-12-09 DIAGNOSIS — E785 Hyperlipidemia, unspecified: Secondary | ICD-10-CM | POA: Diagnosis not present

## 2018-12-10 DIAGNOSIS — R0781 Pleurodynia: Secondary | ICD-10-CM | POA: Diagnosis not present

## 2018-12-10 DIAGNOSIS — R937 Abnormal findings on diagnostic imaging of other parts of musculoskeletal system: Secondary | ICD-10-CM | POA: Diagnosis not present

## 2018-12-10 DIAGNOSIS — C61 Malignant neoplasm of prostate: Secondary | ICD-10-CM | POA: Diagnosis not present

## 2018-12-10 DIAGNOSIS — M47814 Spondylosis without myelopathy or radiculopathy, thoracic region: Secondary | ICD-10-CM | POA: Diagnosis not present

## 2018-12-12 DIAGNOSIS — C61 Malignant neoplasm of prostate: Secondary | ICD-10-CM | POA: Diagnosis not present

## 2018-12-12 DIAGNOSIS — R972 Elevated prostate specific antigen [PSA]: Secondary | ICD-10-CM | POA: Diagnosis not present

## 2018-12-19 DIAGNOSIS — R944 Abnormal results of kidney function studies: Secondary | ICD-10-CM | POA: Diagnosis not present

## 2018-12-24 DIAGNOSIS — C61 Malignant neoplasm of prostate: Secondary | ICD-10-CM | POA: Diagnosis not present

## 2019-01-07 DIAGNOSIS — C61 Malignant neoplasm of prostate: Secondary | ICD-10-CM | POA: Diagnosis not present

## 2019-02-09 DIAGNOSIS — C61 Malignant neoplasm of prostate: Secondary | ICD-10-CM | POA: Diagnosis not present

## 2019-02-19 ENCOUNTER — Other Ambulatory Visit: Payer: Self-pay | Admitting: Cardiology

## 2019-03-03 DIAGNOSIS — Z510 Encounter for antineoplastic radiation therapy: Secondary | ICD-10-CM | POA: Diagnosis not present

## 2019-03-03 DIAGNOSIS — C61 Malignant neoplasm of prostate: Secondary | ICD-10-CM | POA: Diagnosis not present

## 2019-03-06 DIAGNOSIS — C61 Malignant neoplasm of prostate: Secondary | ICD-10-CM | POA: Diagnosis not present

## 2019-03-06 DIAGNOSIS — Z510 Encounter for antineoplastic radiation therapy: Secondary | ICD-10-CM | POA: Diagnosis not present

## 2019-03-10 DIAGNOSIS — Z510 Encounter for antineoplastic radiation therapy: Secondary | ICD-10-CM | POA: Diagnosis not present

## 2019-03-10 DIAGNOSIS — C61 Malignant neoplasm of prostate: Secondary | ICD-10-CM | POA: Diagnosis not present

## 2019-03-11 DIAGNOSIS — Z510 Encounter for antineoplastic radiation therapy: Secondary | ICD-10-CM | POA: Diagnosis not present

## 2019-03-11 DIAGNOSIS — C61 Malignant neoplasm of prostate: Secondary | ICD-10-CM | POA: Diagnosis not present

## 2019-03-11 DIAGNOSIS — E785 Hyperlipidemia, unspecified: Secondary | ICD-10-CM | POA: Diagnosis not present

## 2019-03-11 DIAGNOSIS — I10 Essential (primary) hypertension: Secondary | ICD-10-CM | POA: Diagnosis not present

## 2019-03-11 DIAGNOSIS — R739 Hyperglycemia, unspecified: Secondary | ICD-10-CM | POA: Diagnosis not present

## 2019-03-12 DIAGNOSIS — C61 Malignant neoplasm of prostate: Secondary | ICD-10-CM | POA: Diagnosis not present

## 2019-03-12 DIAGNOSIS — Z510 Encounter for antineoplastic radiation therapy: Secondary | ICD-10-CM | POA: Diagnosis not present

## 2019-03-13 DIAGNOSIS — Z510 Encounter for antineoplastic radiation therapy: Secondary | ICD-10-CM | POA: Diagnosis not present

## 2019-03-13 DIAGNOSIS — C61 Malignant neoplasm of prostate: Secondary | ICD-10-CM | POA: Diagnosis not present

## 2019-03-13 DIAGNOSIS — N3289 Other specified disorders of bladder: Secondary | ICD-10-CM | POA: Diagnosis not present

## 2019-03-13 DIAGNOSIS — R339 Retention of urine, unspecified: Secondary | ICD-10-CM | POA: Diagnosis not present

## 2019-03-16 DIAGNOSIS — Z510 Encounter for antineoplastic radiation therapy: Secondary | ICD-10-CM | POA: Diagnosis not present

## 2019-03-16 DIAGNOSIS — C61 Malignant neoplasm of prostate: Secondary | ICD-10-CM | POA: Diagnosis not present

## 2019-03-17 DIAGNOSIS — C61 Malignant neoplasm of prostate: Secondary | ICD-10-CM | POA: Diagnosis not present

## 2019-03-17 DIAGNOSIS — Z510 Encounter for antineoplastic radiation therapy: Secondary | ICD-10-CM | POA: Diagnosis not present

## 2019-03-18 DIAGNOSIS — Z510 Encounter for antineoplastic radiation therapy: Secondary | ICD-10-CM | POA: Diagnosis not present

## 2019-03-18 DIAGNOSIS — C61 Malignant neoplasm of prostate: Secondary | ICD-10-CM | POA: Diagnosis not present

## 2019-03-19 DIAGNOSIS — Z510 Encounter for antineoplastic radiation therapy: Secondary | ICD-10-CM | POA: Diagnosis not present

## 2019-03-19 DIAGNOSIS — C61 Malignant neoplasm of prostate: Secondary | ICD-10-CM | POA: Diagnosis not present

## 2019-03-20 DIAGNOSIS — Z510 Encounter for antineoplastic radiation therapy: Secondary | ICD-10-CM | POA: Diagnosis not present

## 2019-03-20 DIAGNOSIS — C61 Malignant neoplasm of prostate: Secondary | ICD-10-CM | POA: Diagnosis not present

## 2019-03-23 DIAGNOSIS — C61 Malignant neoplasm of prostate: Secondary | ICD-10-CM | POA: Diagnosis not present

## 2019-03-23 DIAGNOSIS — Z510 Encounter for antineoplastic radiation therapy: Secondary | ICD-10-CM | POA: Diagnosis not present

## 2019-03-24 DIAGNOSIS — Z510 Encounter for antineoplastic radiation therapy: Secondary | ICD-10-CM | POA: Diagnosis not present

## 2019-03-24 DIAGNOSIS — C61 Malignant neoplasm of prostate: Secondary | ICD-10-CM | POA: Diagnosis not present

## 2019-03-25 DIAGNOSIS — Z510 Encounter for antineoplastic radiation therapy: Secondary | ICD-10-CM | POA: Diagnosis not present

## 2019-03-25 DIAGNOSIS — C61 Malignant neoplasm of prostate: Secondary | ICD-10-CM | POA: Diagnosis not present

## 2019-03-26 DIAGNOSIS — C61 Malignant neoplasm of prostate: Secondary | ICD-10-CM | POA: Diagnosis not present

## 2019-03-26 DIAGNOSIS — Z510 Encounter for antineoplastic radiation therapy: Secondary | ICD-10-CM | POA: Diagnosis not present

## 2019-03-27 DIAGNOSIS — C61 Malignant neoplasm of prostate: Secondary | ICD-10-CM | POA: Diagnosis not present

## 2019-03-27 DIAGNOSIS — Z510 Encounter for antineoplastic radiation therapy: Secondary | ICD-10-CM | POA: Diagnosis not present

## 2019-03-30 DIAGNOSIS — Z510 Encounter for antineoplastic radiation therapy: Secondary | ICD-10-CM | POA: Diagnosis not present

## 2019-03-30 DIAGNOSIS — C61 Malignant neoplasm of prostate: Secondary | ICD-10-CM | POA: Diagnosis not present

## 2019-03-31 DIAGNOSIS — C61 Malignant neoplasm of prostate: Secondary | ICD-10-CM | POA: Diagnosis not present

## 2019-03-31 DIAGNOSIS — Z510 Encounter for antineoplastic radiation therapy: Secondary | ICD-10-CM | POA: Diagnosis not present

## 2019-04-01 DIAGNOSIS — C61 Malignant neoplasm of prostate: Secondary | ICD-10-CM | POA: Diagnosis not present

## 2019-04-01 DIAGNOSIS — Z510 Encounter for antineoplastic radiation therapy: Secondary | ICD-10-CM | POA: Diagnosis not present

## 2019-04-02 DIAGNOSIS — Z510 Encounter for antineoplastic radiation therapy: Secondary | ICD-10-CM | POA: Diagnosis not present

## 2019-04-02 DIAGNOSIS — C61 Malignant neoplasm of prostate: Secondary | ICD-10-CM | POA: Diagnosis not present

## 2019-04-03 DIAGNOSIS — Z510 Encounter for antineoplastic radiation therapy: Secondary | ICD-10-CM | POA: Diagnosis not present

## 2019-04-03 DIAGNOSIS — C61 Malignant neoplasm of prostate: Secondary | ICD-10-CM | POA: Diagnosis not present

## 2019-04-06 DIAGNOSIS — C61 Malignant neoplasm of prostate: Secondary | ICD-10-CM | POA: Diagnosis not present

## 2019-04-06 DIAGNOSIS — Z510 Encounter for antineoplastic radiation therapy: Secondary | ICD-10-CM | POA: Diagnosis not present

## 2019-04-07 DIAGNOSIS — C61 Malignant neoplasm of prostate: Secondary | ICD-10-CM | POA: Diagnosis not present

## 2019-04-07 DIAGNOSIS — Z510 Encounter for antineoplastic radiation therapy: Secondary | ICD-10-CM | POA: Diagnosis not present

## 2019-04-08 DIAGNOSIS — Z510 Encounter for antineoplastic radiation therapy: Secondary | ICD-10-CM | POA: Diagnosis not present

## 2019-04-08 DIAGNOSIS — C61 Malignant neoplasm of prostate: Secondary | ICD-10-CM | POA: Diagnosis not present

## 2019-04-09 DIAGNOSIS — C61 Malignant neoplasm of prostate: Secondary | ICD-10-CM | POA: Diagnosis not present

## 2019-04-09 DIAGNOSIS — Z510 Encounter for antineoplastic radiation therapy: Secondary | ICD-10-CM | POA: Diagnosis not present

## 2019-04-10 DIAGNOSIS — C61 Malignant neoplasm of prostate: Secondary | ICD-10-CM | POA: Diagnosis not present

## 2019-04-10 DIAGNOSIS — Z510 Encounter for antineoplastic radiation therapy: Secondary | ICD-10-CM | POA: Diagnosis not present

## 2019-04-13 DIAGNOSIS — C61 Malignant neoplasm of prostate: Secondary | ICD-10-CM | POA: Diagnosis not present

## 2019-04-13 DIAGNOSIS — Z510 Encounter for antineoplastic radiation therapy: Secondary | ICD-10-CM | POA: Diagnosis not present

## 2019-04-13 DIAGNOSIS — R339 Retention of urine, unspecified: Secondary | ICD-10-CM | POA: Diagnosis not present

## 2019-04-13 DIAGNOSIS — N3289 Other specified disorders of bladder: Secondary | ICD-10-CM | POA: Diagnosis not present

## 2019-04-14 DIAGNOSIS — C61 Malignant neoplasm of prostate: Secondary | ICD-10-CM | POA: Diagnosis not present

## 2019-04-14 DIAGNOSIS — Z510 Encounter for antineoplastic radiation therapy: Secondary | ICD-10-CM | POA: Diagnosis not present

## 2019-04-15 DIAGNOSIS — Z510 Encounter for antineoplastic radiation therapy: Secondary | ICD-10-CM | POA: Diagnosis not present

## 2019-04-15 DIAGNOSIS — C61 Malignant neoplasm of prostate: Secondary | ICD-10-CM | POA: Diagnosis not present

## 2019-04-16 DIAGNOSIS — Z510 Encounter for antineoplastic radiation therapy: Secondary | ICD-10-CM | POA: Diagnosis not present

## 2019-04-16 DIAGNOSIS — C61 Malignant neoplasm of prostate: Secondary | ICD-10-CM | POA: Diagnosis not present

## 2019-04-20 DIAGNOSIS — Z510 Encounter for antineoplastic radiation therapy: Secondary | ICD-10-CM | POA: Diagnosis not present

## 2019-04-20 DIAGNOSIS — C61 Malignant neoplasm of prostate: Secondary | ICD-10-CM | POA: Diagnosis not present

## 2019-04-21 DIAGNOSIS — C61 Malignant neoplasm of prostate: Secondary | ICD-10-CM | POA: Diagnosis not present

## 2019-04-21 DIAGNOSIS — Z510 Encounter for antineoplastic radiation therapy: Secondary | ICD-10-CM | POA: Diagnosis not present

## 2019-04-22 DIAGNOSIS — C61 Malignant neoplasm of prostate: Secondary | ICD-10-CM | POA: Diagnosis not present

## 2019-04-22 DIAGNOSIS — Z510 Encounter for antineoplastic radiation therapy: Secondary | ICD-10-CM | POA: Diagnosis not present

## 2019-04-23 DIAGNOSIS — Z510 Encounter for antineoplastic radiation therapy: Secondary | ICD-10-CM | POA: Diagnosis not present

## 2019-04-23 DIAGNOSIS — C61 Malignant neoplasm of prostate: Secondary | ICD-10-CM | POA: Diagnosis not present

## 2019-04-24 DIAGNOSIS — C61 Malignant neoplasm of prostate: Secondary | ICD-10-CM | POA: Diagnosis not present

## 2019-04-24 DIAGNOSIS — Z510 Encounter for antineoplastic radiation therapy: Secondary | ICD-10-CM | POA: Diagnosis not present

## 2019-04-27 DIAGNOSIS — C61 Malignant neoplasm of prostate: Secondary | ICD-10-CM | POA: Diagnosis not present

## 2019-04-27 DIAGNOSIS — Z510 Encounter for antineoplastic radiation therapy: Secondary | ICD-10-CM | POA: Diagnosis not present

## 2019-04-28 DIAGNOSIS — Z510 Encounter for antineoplastic radiation therapy: Secondary | ICD-10-CM | POA: Diagnosis not present

## 2019-04-28 DIAGNOSIS — C61 Malignant neoplasm of prostate: Secondary | ICD-10-CM | POA: Diagnosis not present

## 2019-04-29 DIAGNOSIS — Z510 Encounter for antineoplastic radiation therapy: Secondary | ICD-10-CM | POA: Diagnosis not present

## 2019-04-29 DIAGNOSIS — C61 Malignant neoplasm of prostate: Secondary | ICD-10-CM | POA: Diagnosis not present

## 2019-04-30 DIAGNOSIS — Z510 Encounter for antineoplastic radiation therapy: Secondary | ICD-10-CM | POA: Diagnosis not present

## 2019-04-30 DIAGNOSIS — C61 Malignant neoplasm of prostate: Secondary | ICD-10-CM | POA: Diagnosis not present

## 2019-05-01 DIAGNOSIS — Z510 Encounter for antineoplastic radiation therapy: Secondary | ICD-10-CM | POA: Diagnosis not present

## 2019-05-01 DIAGNOSIS — C61 Malignant neoplasm of prostate: Secondary | ICD-10-CM | POA: Diagnosis not present

## 2019-05-04 DIAGNOSIS — Z510 Encounter for antineoplastic radiation therapy: Secondary | ICD-10-CM | POA: Diagnosis not present

## 2019-05-04 DIAGNOSIS — C61 Malignant neoplasm of prostate: Secondary | ICD-10-CM | POA: Diagnosis not present

## 2019-05-05 DIAGNOSIS — C61 Malignant neoplasm of prostate: Secondary | ICD-10-CM | POA: Diagnosis not present

## 2019-05-05 DIAGNOSIS — Z510 Encounter for antineoplastic radiation therapy: Secondary | ICD-10-CM | POA: Diagnosis not present

## 2019-05-06 DIAGNOSIS — Z510 Encounter for antineoplastic radiation therapy: Secondary | ICD-10-CM | POA: Diagnosis not present

## 2019-05-06 DIAGNOSIS — C61 Malignant neoplasm of prostate: Secondary | ICD-10-CM | POA: Diagnosis not present

## 2019-05-25 DIAGNOSIS — N3289 Other specified disorders of bladder: Secondary | ICD-10-CM | POA: Diagnosis not present

## 2019-05-25 DIAGNOSIS — R339 Retention of urine, unspecified: Secondary | ICD-10-CM | POA: Diagnosis not present

## 2019-05-25 DIAGNOSIS — C61 Malignant neoplasm of prostate: Secondary | ICD-10-CM | POA: Diagnosis not present

## 2019-06-06 DIAGNOSIS — Z23 Encounter for immunization: Secondary | ICD-10-CM | POA: Diagnosis not present

## 2019-06-11 DIAGNOSIS — R739 Hyperglycemia, unspecified: Secondary | ICD-10-CM | POA: Diagnosis not present

## 2019-06-16 DIAGNOSIS — C4441 Basal cell carcinoma of skin of scalp and neck: Secondary | ICD-10-CM | POA: Diagnosis not present

## 2019-06-16 DIAGNOSIS — C44519 Basal cell carcinoma of skin of other part of trunk: Secondary | ICD-10-CM | POA: Diagnosis not present

## 2019-06-16 DIAGNOSIS — L578 Other skin changes due to chronic exposure to nonionizing radiation: Secondary | ICD-10-CM | POA: Diagnosis not present

## 2019-06-16 DIAGNOSIS — L814 Other melanin hyperpigmentation: Secondary | ICD-10-CM | POA: Diagnosis not present

## 2019-06-19 DIAGNOSIS — C61 Malignant neoplasm of prostate: Secondary | ICD-10-CM | POA: Diagnosis not present

## 2019-06-25 DIAGNOSIS — C44519 Basal cell carcinoma of skin of other part of trunk: Secondary | ICD-10-CM | POA: Diagnosis not present

## 2019-07-07 DIAGNOSIS — R339 Retention of urine, unspecified: Secondary | ICD-10-CM | POA: Diagnosis not present

## 2019-07-07 DIAGNOSIS — N3289 Other specified disorders of bladder: Secondary | ICD-10-CM | POA: Diagnosis not present

## 2019-07-07 DIAGNOSIS — C61 Malignant neoplasm of prostate: Secondary | ICD-10-CM | POA: Diagnosis not present

## 2019-07-13 DIAGNOSIS — C4441 Basal cell carcinoma of skin of scalp and neck: Secondary | ICD-10-CM | POA: Diagnosis not present

## 2019-07-15 DIAGNOSIS — C61 Malignant neoplasm of prostate: Secondary | ICD-10-CM | POA: Diagnosis not present

## 2019-09-08 IMAGING — DX PORTABLE CHEST - 1 VIEW
1 series · 1 of 1 positions shown · non-contrast
Comparison: 06/28/2005

CLINICAL DATA: Reason for exam: L Rib fractures, pt fell after
passing out on [REDACTED]. No chest pain today. Hx of HTN.

EXAM:
PORTABLE CHEST - 1 VIEW

[chest]
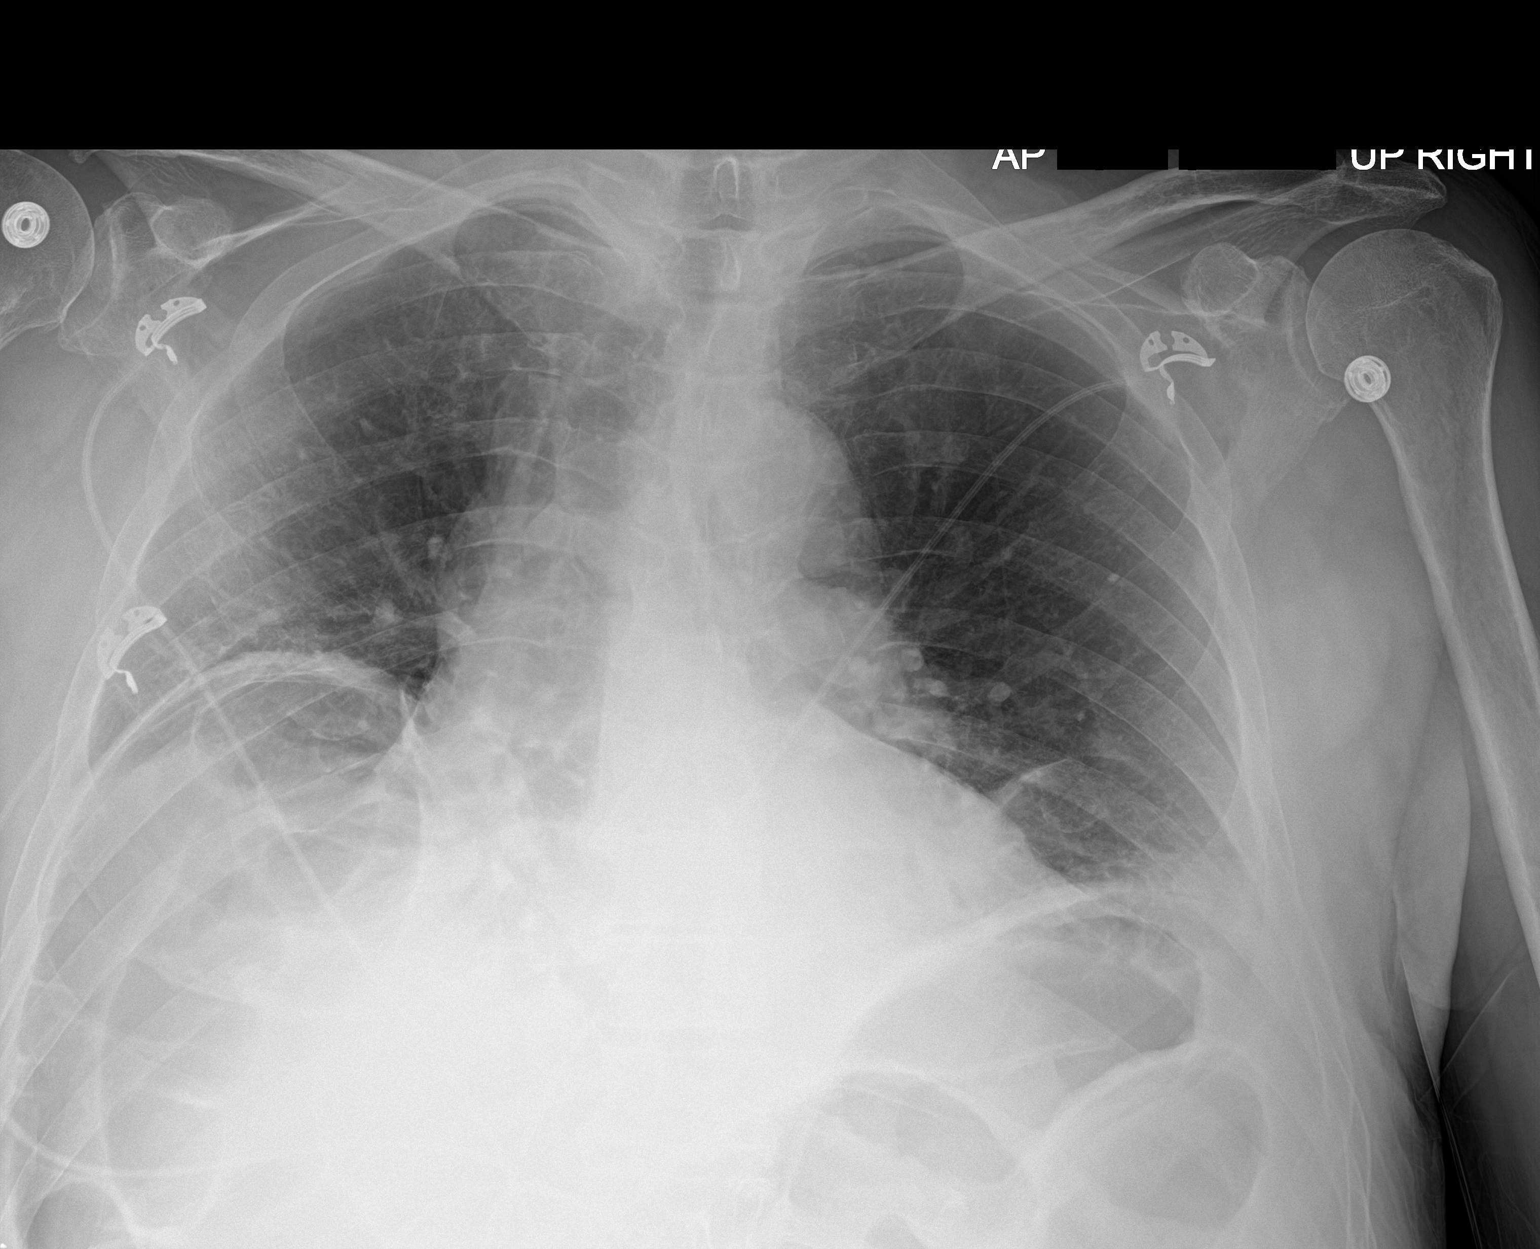

[1 of 1 positions shown; findings below may reference images not displayed]

FINDINGS: Low volumes. Coarse linear scarring, atelectasis or infiltrate at
the lung bases. No overt edema.

Heart size upper limits normal for technique.

Blunting of the left lateral costophrenic angle.  No pneumothorax.

Possible fracture deformity of the anterolateral aspect left seventh
rib, age indeterminate.
IMPRESSION: Low volumes with nonspecific bibasilar opacities

## 2019-09-11 DIAGNOSIS — R739 Hyperglycemia, unspecified: Secondary | ICD-10-CM | POA: Diagnosis not present

## 2019-09-11 DIAGNOSIS — I10 Essential (primary) hypertension: Secondary | ICD-10-CM | POA: Diagnosis not present

## 2019-09-11 DIAGNOSIS — C61 Malignant neoplasm of prostate: Secondary | ICD-10-CM | POA: Diagnosis not present

## 2019-09-11 DIAGNOSIS — E785 Hyperlipidemia, unspecified: Secondary | ICD-10-CM | POA: Diagnosis not present

## 2019-09-11 DIAGNOSIS — Z139 Encounter for screening, unspecified: Secondary | ICD-10-CM | POA: Diagnosis not present

## 2019-11-13 ENCOUNTER — Other Ambulatory Visit: Payer: Self-pay | Admitting: Cardiology

## 2019-11-13 NOTE — Telephone Encounter (Signed)
Please call pt and ask him to have refilled by PCP. Pt is PRN w/ [facility] as of 08/2018.

## 2020-02-02 DIAGNOSIS — Z1331 Encounter for screening for depression: Secondary | ICD-10-CM | POA: Diagnosis not present

## 2020-02-02 DIAGNOSIS — E785 Hyperlipidemia, unspecified: Secondary | ICD-10-CM | POA: Diagnosis not present

## 2020-02-02 DIAGNOSIS — Z9181 History of falling: Secondary | ICD-10-CM | POA: Diagnosis not present

## 2020-02-02 DIAGNOSIS — Z0000 Encounter for general adult medical examination without abnormal findings: Secondary | ICD-10-CM | POA: Diagnosis not present

## 2020-03-14 DIAGNOSIS — E785 Hyperlipidemia, unspecified: Secondary | ICD-10-CM | POA: Diagnosis not present

## 2020-03-14 DIAGNOSIS — Z6828 Body mass index (BMI) 28.0-28.9, adult: Secondary | ICD-10-CM | POA: Diagnosis not present

## 2020-03-14 DIAGNOSIS — R739 Hyperglycemia, unspecified: Secondary | ICD-10-CM | POA: Diagnosis not present

## 2020-03-14 DIAGNOSIS — Z8546 Personal history of malignant neoplasm of prostate: Secondary | ICD-10-CM | POA: Diagnosis not present

## 2020-03-14 DIAGNOSIS — I10 Essential (primary) hypertension: Secondary | ICD-10-CM | POA: Diagnosis not present

## 2020-03-14 DIAGNOSIS — C61 Malignant neoplasm of prostate: Secondary | ICD-10-CM | POA: Diagnosis not present

## 2020-03-23 DIAGNOSIS — C44519 Basal cell carcinoma of skin of other part of trunk: Secondary | ICD-10-CM | POA: Diagnosis not present

## 2020-09-05 DIAGNOSIS — Z20828 Contact with and (suspected) exposure to other viral communicable diseases: Secondary | ICD-10-CM | POA: Diagnosis not present

## 2020-09-05 DIAGNOSIS — R0981 Nasal congestion: Secondary | ICD-10-CM | POA: Diagnosis not present

## 2020-09-05 DIAGNOSIS — R051 Acute cough: Secondary | ICD-10-CM | POA: Diagnosis not present

## 2020-09-05 DIAGNOSIS — R0600 Dyspnea, unspecified: Secondary | ICD-10-CM | POA: Diagnosis not present

## 2020-09-05 DIAGNOSIS — R509 Fever, unspecified: Secondary | ICD-10-CM | POA: Diagnosis not present

## 2020-10-18 DIAGNOSIS — I10 Essential (primary) hypertension: Secondary | ICD-10-CM | POA: Diagnosis not present

## 2020-10-18 DIAGNOSIS — E785 Hyperlipidemia, unspecified: Secondary | ICD-10-CM | POA: Diagnosis not present

## 2020-10-18 DIAGNOSIS — Z79899 Other long term (current) drug therapy: Secondary | ICD-10-CM | POA: Diagnosis not present

## 2020-10-18 DIAGNOSIS — Z8546 Personal history of malignant neoplasm of prostate: Secondary | ICD-10-CM | POA: Diagnosis not present

## 2020-10-18 DIAGNOSIS — R739 Hyperglycemia, unspecified: Secondary | ICD-10-CM | POA: Diagnosis not present

## 2020-10-18 DIAGNOSIS — Z139 Encounter for screening, unspecified: Secondary | ICD-10-CM | POA: Diagnosis not present

## 2020-10-18 DIAGNOSIS — Z6829 Body mass index (BMI) 29.0-29.9, adult: Secondary | ICD-10-CM | POA: Diagnosis not present

## 2020-12-01 DIAGNOSIS — R718 Other abnormality of red blood cells: Secondary | ICD-10-CM | POA: Diagnosis not present

## 2021-04-25 DIAGNOSIS — Z9181 History of falling: Secondary | ICD-10-CM | POA: Diagnosis not present

## 2021-04-25 DIAGNOSIS — R739 Hyperglycemia, unspecified: Secondary | ICD-10-CM | POA: Diagnosis not present

## 2021-04-25 DIAGNOSIS — Z6830 Body mass index (BMI) 30.0-30.9, adult: Secondary | ICD-10-CM | POA: Diagnosis not present

## 2021-04-25 DIAGNOSIS — E785 Hyperlipidemia, unspecified: Secondary | ICD-10-CM | POA: Diagnosis not present

## 2021-04-25 DIAGNOSIS — I10 Essential (primary) hypertension: Secondary | ICD-10-CM | POA: Diagnosis not present

## 2021-04-25 DIAGNOSIS — Z1331 Encounter for screening for depression: Secondary | ICD-10-CM | POA: Diagnosis not present

## 2021-04-25 DIAGNOSIS — Z8546 Personal history of malignant neoplasm of prostate: Secondary | ICD-10-CM | POA: Diagnosis not present

## 2021-07-05 DIAGNOSIS — N390 Urinary tract infection, site not specified: Secondary | ICD-10-CM | POA: Diagnosis not present

## 2021-07-05 DIAGNOSIS — C61 Malignant neoplasm of prostate: Secondary | ICD-10-CM | POA: Diagnosis not present

## 2021-07-05 DIAGNOSIS — R339 Retention of urine, unspecified: Secondary | ICD-10-CM | POA: Diagnosis not present

## 2021-08-21 DIAGNOSIS — I10 Essential (primary) hypertension: Secondary | ICD-10-CM | POA: Diagnosis not present

## 2021-08-21 DIAGNOSIS — E1169 Type 2 diabetes mellitus with other specified complication: Secondary | ICD-10-CM | POA: Diagnosis not present

## 2021-08-21 DIAGNOSIS — Z6828 Body mass index (BMI) 28.0-28.9, adult: Secondary | ICD-10-CM | POA: Diagnosis not present

## 2021-08-21 DIAGNOSIS — Z8546 Personal history of malignant neoplasm of prostate: Secondary | ICD-10-CM | POA: Diagnosis not present

## 2021-08-21 DIAGNOSIS — E785 Hyperlipidemia, unspecified: Secondary | ICD-10-CM | POA: Diagnosis not present

## 2022-03-05 DIAGNOSIS — E785 Hyperlipidemia, unspecified: Secondary | ICD-10-CM | POA: Diagnosis not present

## 2022-03-05 DIAGNOSIS — I10 Essential (primary) hypertension: Secondary | ICD-10-CM | POA: Diagnosis not present

## 2022-03-05 DIAGNOSIS — E1169 Type 2 diabetes mellitus with other specified complication: Secondary | ICD-10-CM | POA: Diagnosis not present

## 2022-03-05 DIAGNOSIS — Z6827 Body mass index (BMI) 27.0-27.9, adult: Secondary | ICD-10-CM | POA: Diagnosis not present

## 2022-06-13 DIAGNOSIS — E785 Hyperlipidemia, unspecified: Secondary | ICD-10-CM | POA: Diagnosis not present

## 2022-06-13 DIAGNOSIS — E1169 Type 2 diabetes mellitus with other specified complication: Secondary | ICD-10-CM | POA: Diagnosis not present

## 2022-06-13 DIAGNOSIS — Z8546 Personal history of malignant neoplasm of prostate: Secondary | ICD-10-CM | POA: Diagnosis not present

## 2022-06-13 DIAGNOSIS — Z6827 Body mass index (BMI) 27.0-27.9, adult: Secondary | ICD-10-CM | POA: Diagnosis not present

## 2022-06-13 DIAGNOSIS — I10 Essential (primary) hypertension: Secondary | ICD-10-CM | POA: Diagnosis not present

## 2022-06-13 DIAGNOSIS — Z1331 Encounter for screening for depression: Secondary | ICD-10-CM | POA: Diagnosis not present

## 2022-06-13 DIAGNOSIS — Z139 Encounter for screening, unspecified: Secondary | ICD-10-CM | POA: Diagnosis not present

## 2022-06-13 DIAGNOSIS — Z9181 History of falling: Secondary | ICD-10-CM | POA: Diagnosis not present

## 2022-12-18 DIAGNOSIS — Z6827 Body mass index (BMI) 27.0-27.9, adult: Secondary | ICD-10-CM | POA: Diagnosis not present

## 2022-12-18 DIAGNOSIS — E785 Hyperlipidemia, unspecified: Secondary | ICD-10-CM | POA: Diagnosis not present

## 2022-12-18 DIAGNOSIS — E1169 Type 2 diabetes mellitus with other specified complication: Secondary | ICD-10-CM | POA: Diagnosis not present

## 2022-12-18 DIAGNOSIS — R972 Elevated prostate specific antigen [PSA]: Secondary | ICD-10-CM | POA: Diagnosis not present

## 2022-12-18 DIAGNOSIS — N401 Enlarged prostate with lower urinary tract symptoms: Secondary | ICD-10-CM | POA: Diagnosis not present

## 2022-12-18 DIAGNOSIS — I10 Essential (primary) hypertension: Secondary | ICD-10-CM | POA: Diagnosis not present

## 2023-01-14 DIAGNOSIS — Z139 Encounter for screening, unspecified: Secondary | ICD-10-CM | POA: Diagnosis not present

## 2023-01-14 DIAGNOSIS — Z9181 History of falling: Secondary | ICD-10-CM | POA: Diagnosis not present

## 2023-01-14 DIAGNOSIS — Z1331 Encounter for screening for depression: Secondary | ICD-10-CM | POA: Diagnosis not present

## 2023-01-14 DIAGNOSIS — Z0000 Encounter for general adult medical examination without abnormal findings: Secondary | ICD-10-CM | POA: Diagnosis not present

## 2023-06-19 DIAGNOSIS — E785 Hyperlipidemia, unspecified: Secondary | ICD-10-CM | POA: Diagnosis not present

## 2023-06-19 DIAGNOSIS — Z8546 Personal history of malignant neoplasm of prostate: Secondary | ICD-10-CM | POA: Diagnosis not present

## 2023-06-19 DIAGNOSIS — R972 Elevated prostate specific antigen [PSA]: Secondary | ICD-10-CM | POA: Diagnosis not present

## 2023-06-19 DIAGNOSIS — E1169 Type 2 diabetes mellitus with other specified complication: Secondary | ICD-10-CM | POA: Diagnosis not present

## 2023-06-19 DIAGNOSIS — I10 Essential (primary) hypertension: Secondary | ICD-10-CM | POA: Diagnosis not present

## 2023-06-19 DIAGNOSIS — N401 Enlarged prostate with lower urinary tract symptoms: Secondary | ICD-10-CM | POA: Diagnosis not present

## 2023-07-11 DIAGNOSIS — N401 Enlarged prostate with lower urinary tract symptoms: Secondary | ICD-10-CM | POA: Diagnosis not present
